# Patient Record
Sex: Male | Born: 1955 | Race: White
Health system: Southern US, Community
[De-identification: ages and names within clinical notes are randomized; demographics above are authoritative.]

## PROBLEM LIST (undated history)

## (undated) DIAGNOSIS — T148XXA Other injury of unspecified body region, initial encounter: Secondary | ICD-10-CM

## (undated) DIAGNOSIS — S43439A Superior glenoid labrum lesion of unspecified shoulder, initial encounter: Secondary | ICD-10-CM

## (undated) DIAGNOSIS — M25519 Pain in unspecified shoulder: Secondary | ICD-10-CM

## (undated) DIAGNOSIS — S82899A Other fracture of unspecified lower leg, initial encounter for closed fracture: Secondary | ICD-10-CM

## (undated) DIAGNOSIS — G589 Mononeuropathy, unspecified: Secondary | ICD-10-CM

## (undated) HISTORY — DX: Superior glenoid labrum lesion of unspecified shoulder, initial encounter: S43.439A

## (undated) HISTORY — DX: Mononeuropathy, unspecified: G58.9

## (undated) HISTORY — PX: WISDOM TOOTH EXTRACTION: SHX21

## (undated) HISTORY — PX: HERNIA REPAIR: SHX51

## (undated) HISTORY — PX: VASECTOMY: SHX75

## (undated) HISTORY — PX: SKIN BIOPSY: SHX1

## (undated) HISTORY — PX: TONSILLECTOMY: SUR1361

## (undated) HISTORY — DX: Pain in unspecified shoulder: M25.519

## (undated) HISTORY — DX: Other fracture of unspecified lower leg, initial encounter for closed fracture: S82.899A

---

## 1975-01-27 DIAGNOSIS — S82892A Other fracture of left lower leg, initial encounter for closed fracture: Secondary | ICD-10-CM

## 1975-01-27 HISTORY — DX: Other fracture of left lower leg, initial encounter for closed fracture: S82.892A

## 1995-04-09 ENCOUNTER — Ambulatory Visit
Admit: 1995-04-09 | Disposition: A | Discharge status: Home / Self Care | Payer: Self-pay | Source: Ambulatory Visit | Admitting: Urology

## 1999-01-27 DIAGNOSIS — E0500 Thyrotoxicosis with diffuse goiter without thyrotoxic crisis or storm: Secondary | ICD-10-CM

## 1999-01-27 DIAGNOSIS — S76309A Unspecified injury of muscle, fascia and tendon of the posterior muscle group at thigh level, unspecified thigh, initial encounter: Secondary | ICD-10-CM

## 1999-01-27 DIAGNOSIS — I499 Cardiac arrhythmia, unspecified: Secondary | ICD-10-CM

## 1999-01-27 DIAGNOSIS — E0590 Thyrotoxicosis, unspecified without thyrotoxic crisis or storm: Secondary | ICD-10-CM

## 1999-01-27 HISTORY — DX: Unspecified injury of muscle, fascia and tendon of the posterior muscle group at thigh level, unspecified thigh, initial encounter: S76.309A

## 1999-01-27 HISTORY — DX: Cardiac arrhythmia, unspecified: I49.9

## 1999-01-27 HISTORY — DX: Thyrotoxicosis with diffuse goiter without thyrotoxic crisis or storm: E05.00

## 1999-01-27 HISTORY — DX: Thyrotoxicosis, unspecified without thyrotoxic crisis or storm: E05.90

## 1999-10-21 ENCOUNTER — Emergency Department: Admit: 1999-10-21 | Payer: Self-pay | Admitting: Emergency Medicine

## 2014-01-26 DIAGNOSIS — M5430 Sciatica, unspecified side: Secondary | ICD-10-CM

## 2014-01-26 HISTORY — DX: Sciatica, unspecified side: M54.30

## 2015-01-27 DIAGNOSIS — K4090 Unilateral inguinal hernia, without obstruction or gangrene, not specified as recurrent: Secondary | ICD-10-CM

## 2015-01-27 HISTORY — DX: Unilateral inguinal hernia, without obstruction or gangrene, not specified as recurrent: K40.90

## 2017-01-26 DIAGNOSIS — M722 Plantar fascial fibromatosis: Secondary | ICD-10-CM

## 2017-01-26 DIAGNOSIS — K4090 Unilateral inguinal hernia, without obstruction or gangrene, not specified as recurrent: Secondary | ICD-10-CM

## 2017-01-26 HISTORY — DX: Unilateral inguinal hernia, without obstruction or gangrene, not specified as recurrent: K40.90

## 2017-01-26 HISTORY — DX: Plantar fascial fibromatosis: M72.2

## 2017-06-09 ENCOUNTER — Other Ambulatory Visit (INDEPENDENT_AMBULATORY_CARE_PROVIDER_SITE_OTHER): Payer: Self-pay

## 2017-06-23 ENCOUNTER — Ambulatory Visit (INDEPENDENT_AMBULATORY_CARE_PROVIDER_SITE_OTHER): Payer: Self-pay | Admitting: Family Medicine

## 2017-08-11 ENCOUNTER — Ambulatory Visit: Payer: Self-pay | Admitting: Family Medicine

## 2017-08-11 ENCOUNTER — Encounter: Payer: Self-pay | Admitting: Family Medicine

## 2017-08-11 VITALS — BP 140/86 | HR 42 | Temp 98.0°F | Resp 16 | Ht 70.75 in | Wt 180.3 lb

## 2017-08-11 DIAGNOSIS — H6981 Other specified disorders of Eustachian tube, right ear: Secondary | ICD-10-CM

## 2017-08-11 DIAGNOSIS — K4090 Unilateral inguinal hernia, without obstruction or gangrene, not specified as recurrent: Secondary | ICD-10-CM

## 2017-08-11 DIAGNOSIS — Z8249 Family history of ischemic heart disease and other diseases of the circulatory system: Secondary | ICD-10-CM | POA: Insufficient documentation

## 2017-08-11 DIAGNOSIS — R7982 Elevated C-reactive protein (CRP): Secondary | ICD-10-CM

## 2017-08-11 DIAGNOSIS — J302 Other seasonal allergic rhinitis: Secondary | ICD-10-CM | POA: Insufficient documentation

## 2017-08-11 DIAGNOSIS — Z8639 Personal history of other endocrine, nutritional and metabolic disease: Secondary | ICD-10-CM | POA: Insufficient documentation

## 2017-08-11 DIAGNOSIS — Z23 Encounter for immunization: Secondary | ICD-10-CM

## 2017-08-11 DIAGNOSIS — R9431 Abnormal electrocardiogram [ECG] [EKG]: Secondary | ICD-10-CM

## 2017-08-11 DIAGNOSIS — E559 Vitamin D deficiency, unspecified: Secondary | ICD-10-CM

## 2017-08-11 DIAGNOSIS — Z0000 Encounter for general adult medical examination without abnormal findings: Secondary | ICD-10-CM

## 2017-08-11 DIAGNOSIS — Z7184 Encounter for health counseling related to travel: Secondary | ICD-10-CM

## 2017-08-11 DIAGNOSIS — Z1211 Encounter for screening for malignant neoplasm of colon: Secondary | ICD-10-CM

## 2017-08-11 DIAGNOSIS — E7841 Elevated Lipoprotein(a): Secondary | ICD-10-CM | POA: Insufficient documentation

## 2017-08-11 MED ORDER — ZOSTER VAC RECOMB ADJUVANTED 50 MCG/0.5ML IM SUSR
50.00 ug | Freq: Once | INTRAMUSCULAR | 1 refills | Status: AC
Start: 2017-08-11 — End: 2017-08-11

## 2017-08-11 NOTE — Progress Notes (Signed)
Subjective:      Patient ID: Joshua Meyers is a 62 y.o. male.    Chief Complaint:  Chief Complaint   Patient presents with   . Annual Exam       HPI:  HPI Patient presents for annual health review and physical exam. He was previously a patient of Dr. Zenovia Jarred. Last physical was 05/2013.  History of low back pain with nerve impingement at L3. Flies on planes frequently. Managed with stretching and exercise.   History of seasonal allergies managed with over the counter meds.   History of elevated Lp(a). Heart disease in father. Had stress test in the past.   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 No recurrence.     Current: has a left inguinal hernia that developed over the last 24 months.   Plantar fasciitis in right heel over last couple months. Has used wrap which stabilzed it. Tried CBD oil which helped with pain temprorarily. Has a massage device he uses. Also benefits from water jets in hot tub and helps it to relax. Has not tried acupuncture.     Ear: intermittent sharp pain in ear when blows nose. Has had in the past with flying. Resolves immediately. Has some relationship to congestion. Always the right ear.     Hx of allergies: takes meds sparingly, uses claritin infrequently. Has been much better since [employer].     Joint pains from previous injuries Manages with exercise and stretching.     Changes in last year: no hospitalizations or ER visits.     Planning visit to [place] for the end of the year. Needs immunizations reviewed for update.       Review of labs:none done prior to visit.     Exercise:Weight lifting 2 days a week. Runs almost daily. Gets 15K steps a day.     Sleep: Sleeps well, even with multiple overseas trips. Does not need any sleep meds or melatonin. Sleeps very deeply and has constantly changing time zones. Sleep times vary depending on schedule, never regular.     Diet: Follows low lectin Gundry diet and has for 2-3 years. Does  well for 5 days a week and enjoys himself on the weekend. Takes some supplements for dietary coverage. Uses plant based fiber that Gundry recommends. Takes this daily.   Adds post-workout plant based protein supplement to keep muscle mass. Has been doing for the last year.   Tried intermittent fasting in the past and got some heart flutters so stopped it.   Weight is always between 175 and 180 pounds.     Stress adaptation: travels, business. Does mindfulness but not disciplined with this. Doesn't "carry things home". Doesn't obsess over outcomes or processes. May not be managing stress as well as in the past.     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     Social:Has reduced alcohol intake. Maybe one drink a week.     Health maintenance:  Colon cancer screening:colonoscopy in past, at least 10 years. No family hx. Will do cologuard.   Screening CT of chest if hx smoking:n/a  Hepatitis C (one time if [phone]): needs  Dental visit: 2 years ago, has scheduled  Eye exam: has eye exam scheduled.     Immunizations:  Tetanus: 2015 Tdap  Prevnar:n/a  Pneumovax:n/a  Shingrix: will recommend  WJX:BJYNW'G get.     Advanced directive: Advanced directive discussed.  Has an Scientist, water quality.  A copy has not been provided. Requested to provide.    PFTs:normal    Hearing:normal    Vision:deferred    OZH:YQMVH brady at 42, slower than in past. NSTwave change. Asymptomatic, referred for cardio eval.     BIA: excellent SMM, BF%, viseral fat 5. Discussed in detail.     Epworth Sleepiness Scale:7  STOP BANG:n/a    Problem List:  Patient Active Problem List   Diagnosis   . History of Graves' disease   . Seasonal allergies   . Elevated Lp(a)   . Family history of heart disease   . Vitamin D deficiency       Current Medications:  Current Outpatient Prescriptions   Medication Sig Dispense Refill   . Ascorbic Acid (VITAMIN C) 1000 MG tablet Take 1,000 mg by mouth daily     . Aspirin-Acetaminophen-Caffeine  (EXCEDRIN EXTRA STRENGTH PO) Take 0.5 tablets by mouth daily     . COLOSTRUM PO Take 1,000 mg by mouth daily     . ECHINACEA PO Take 400 mg by mouth daily     . Fiber Powder Take 1 scoop. by mouth daily Prebio Thrive     . loratadine (CLARITIN) 10 MG tablet Take 10 mg by mouth as needed for Allergies     . Misc Natural Products (GLUCOSAMINE CHONDROITIN MSM) Tab Take 1,500 mg by mouth daily     . Multiple Vitamin (MULTIVITAMIN) capsule Take 1 capsule by mouth daily Centrum Silver        . Omega-3 Fatty Acids (OMEGA 3 PO) Take 1,000 mg by mouth daily         . TURMERIC PO Take 400 mg by mouth daily         . UNABLE TO FIND Take 1 scoop. by mouth daily Med Name: Vital Reds     . Zoster Vac Recomb Adjuvanted (SHINGRIX) 50 MCG/0.5ML Recon Susp Inject 50 mcg into the muscle once for 1 dose 1 each 1     No current facility-administered medications for this visit.        Allergies:  Allergies   Allergen Reactions   . Dust Mite Mixed Allergen Ext [Mite (D. Farinae)]    . Other      Ragweed, seasonal allergies, animal dander       Past Medical History:  Past Medical History:   Diagnosis Date   . Ankle fracture 1960s    Right ankle   . Ankle fracture, left 1977   . Graves disease 2001    Treated 2001   . Hamstring injury 2001   . Hernia, inguinal 2017    Left side   . Pinched nerve     L3   . Plantar fasciitis of right foot 2019   . Sciatica 2016       Past Surgical History:  Past Surgical History:   Procedure Laterality Date   . TONSILLECTOMY  age 73   . VASECTOMY  1992   . VASECTOMY  1992   . WISDOM TOOTH EXTRACTION  age 34       Family History:  Family History   Problem Relation Age of Onset   . Cancer Mother         Bladder   . Stroke Mother    . Heart disease Father    . Dementia Father    . Melanoma Father    . No known problems Sister    . No known problems Brother    . Diabetes  type I Daughter    . Thyroid disease Daughter         s/p ablation   . No known problems Son    . Obesity Maternal Grandmother          overweight   . No known problems Maternal Grandfather    . Alzheimer's disease Paternal Grandmother    . Heart disease Paternal Grandfather    . Heart attack Paternal Grandfather        Social History:  Social History     Social History   . Marital status: Married     Spouse name: N/A   . Number of children: N/A   . Years of education: N/A     Occupational History   . Not on file.     Social History Main Topics   . Smoking status: Never Smoker   . Smokeless tobacco: Never Used      Comment: occasional cigar   . Alcohol use 4.2 oz/week     7 Glasses of wine per week      Comment: wine or martiini, 1 drink daily   . Drug use: No   . Sexual activity: Not on file     Other Topics Concern   . Not on file     Social History Narrative   . No narrative on file       The following sections were reviewed this encounter by the provider:   Allergies  Meds  Problems         ROS:  Review of Systems   Constitutional:        High stress   HENT: Positive for congestion and ear pain.    Musculoskeletal: Positive for arthralgias.   All other systems reviewed and are negative.      Vitals:  BP 140/86 (BP Site: Left arm, Patient Position: Sitting, Cuff Size: Medium)   Pulse (!) 42   Temp 98 F (36.7 C) (Oral)   Resp 16   Ht 1.797 m (5' 10.75")   Wt 81.8 kg (180 lb 4.8 oz)   SpO2 98%   BMI 25.32 kg/m      Objective:     Physical Exam:  Physical Exam General Appearance: NAD, pleasant  HEENT: Eyes: clear  Nose: mild congestion, narrow nasal passages, minimal white/clear discharge  Tympanic Membranes: normal  Pharynx: clear  Oral cavity: no lesions, dentition in good repair  Neck: supple without lymphadenopathy  Thyroid: normal size and consistency  Heart: RRR without murmur, gallop, or rub  Lungs: clear to auscultation  Abdomen: soft, non-tender, non-distended, no masses palpated, no hepatosplenomegaly  Neurologic exam: PERRL, no focal signs, DTRs 2+ and symmetric  Skin: normal color and temp, multiple seborrheic  keratoses  Peripheral pulses: DP and PT 2+, equal bilaterally  Extremities: no clubbing, no edema  Genitalia: normal male without lesions; testes NT and without masses; large left inguinal hernia, reducible; suspect small right inguinal hernia as well  Rectal: no masses, no hemorrhoids, FOBT negative  Prostate: normal size and consistency         Assessment:     1. Routine adult health maintenance  - Comprehensive metabolic panel; Future  - CBC and differential; Future  - PSA Total (Reflex To Free); Future  - Urinalysis with microscopic; Future  - Hepatitis C antibody; Future    2. History of Graves' disease  - TSH; Future  - T4, free; Future  - T3, free; Future    3. Seasonal  allergies    4. Elevated Lp(a)  - CRP, High Sensitive; Future  - Hemoglobin A1C; Future  - Cardio IQ(R) Advanced Lipid Panel; Future    5. Family history of heart disease  - Cardio IQ(R) Advanced Lipid Panel; Future    6. Vitamin D deficiency  - Vitamin D,25 OH, Total; Future    7. Colon cancer screening  - Cologuard; Future    8. Dysfunction of right eustachian tube  Mild, no need for intervention. Manage allergies as usual.   9. Left inguinal hernia  - Ambulatory referral to General Surgery    10. Abnormal EKG  - Cardiology Referral: Rogelio Seen, MD [facility])    11. Travel advice encounter  - Hepatitis B surface antibody; Future    12. Elevated C-reactive protein (CRP)  - CRP, High Sensitive; Future    13. Immunization due  - Zoster Vac Recomb Adjuvanted (SHINGRIX) 50 MCG/0.5ML Recon Susp; Inject 50 mcg into the muscle once for 1 dose  Dispense: 1 each; Refill: 1      Plan:     Schedule a fasting lab visit in the next week or two.   I will report results to you. If needed/desired, we can schedule a phone follow up to go over results.   Collect the cologuard test at home.     Send [place] a copy of your advanced directive.     Schedule to see Dr. Michael Boston or colleague Roselee Nova for hernia evaluation.     Schedule to  see Dr. Georgeanna Lea cardiologist for further evaluation of abnormal EKG. Likely no issue but some slight changes.     You can have the Shingrix vaccine done at your local pharmacy (rx provided) or here, depending on who gets the vaccine in first. It is a 2 shot series and can make you feel a little ill for a day or two.     I will review immunizations and make recommendations for [place] trip later this year.     If plantar fasciitis does not continue to improve, could do a couple of acupuncture sessions     Over 1/2 of the 90 minute appointment was spent in face-to-face discussion, counselling, and management.     Elane Fritz, MD

## 2017-08-11 NOTE — Patient Instructions (Signed)
1. Routine adult health maintenance  - Comprehensive metabolic panel; Future  - CBC and differential; Future  - PSA Total (Reflex To Free); Future  - Urinalysis with microscopic; Future  - Hepatitis C antibody; Future    2. History of Graves' disease  - TSH; Future  - T4, free; Future  - T3, free; Future    3. Seasonal allergies    4. Elevated Lp(a)  - CRP, High Sensitive; Future  - Hemoglobin A1C; Future  - Cardio IQ(R) Advanced Lipid Panel; Future    5. Family history of heart disease  - Cardio IQ(R) Advanced Lipid Panel; Future    6. Vitamin D deficiency  - Vitamin D,25 OH, Total; Future    7. Colon cancer screening  - Cologuard; Future    8. Dysfunction of right eustachian tube  Mild, no need for intervention. Manage allergies as usual.   9. Left inguinal hernia  - Ambulatory referral to General Surgery    10. Abnormal EKG  - Cardiology Referral: Rogelio Seen, MD [facility] - )    11. Travel advice encounter  - Hepatitis B surface antibody; Future    12. Elevated C-reactive protein (CRP)  - CRP, High Sensitive; Future    13. Immunization due  - Zoster Vac Recomb Adjuvanted (SHINGRIX) 50 MCG/0.5ML Recon Susp; Inject 50 mcg into the muscle once for 1 dose  Dispense: 1 each; Refill: 1      Plan:     Schedule a fasting lab visit in the next week or two.   I will report results to you. If needed/desired, we can schedule a phone follow up to go over results.   Collect the cologuard test at home.     Send [place] a copy of your advanced directive.     Schedule to see Dr. Michael Boston or colleague Roselee Nova for hernia evaluation.     Schedule to see Dr. Georgeanna Lea cardiologist for further evaluation of abnormal EKG. Likely no issue but some slight changes.     You can have the Shingrix vaccine done at your local pharmacy (rx provided) or here, depending on who gets the vaccine in first. It is a 2 shot series and can make you feel a little ill for a day or two.     I will review immunizations and make  recommendations for [place] trip later this year.     If plantar fasciitis does not continue to improve, could do a couple of acupuncture sessions     Elane Fritz, MD

## 2017-08-11 NOTE — Progress Notes (Signed)
Audiology test: Hearing test administered to patient. Went over results with patient in a general form by nurse. Results reviewed with patient by physician in detail.     Vision test: Test deferred; patient sees opthalmologist annually for cataract/glaucoma screening. Patient reports needs updated prescription for new glasses. Plans to see eye doctor in near future.     Pulmonary Function Test:Spirometry test administered to patient. Went over results with patient in a general form by nurse. Results reviewed with patient by physician in detail.     Fitness Evaluation:    Body Fat as percentage: In men, over 25% is obese, 20-25% is higher than normal, 16-20% is healthy / normal, <16% or under is considered lean / ideal.   2019 = 16.8%     Visceral Fat: Abdominal "belly" fat.Visceral fat is a type of body fat that exists in the abdomen and surrounds the internal organs. A high level of visceral fat can increase your risk for serious health problems including cardiovascular disease, types 2 diabetes, and increased blood pressure.    2019 = 5 (normal <10).     Fitness / Nutrition:   Current Exercise regimen Runs 3 miles 2-4x per week, lifts weights 2x/weekly, plays 18 holes of golf (sumer only) once a week, stretch through yoga moves 4x per week, walks extensively, and uses a stand-up desk at [facility].   Patient reports daily calorie burn is 900 calories, with an exercise equivalent of an hour a day; typical steps are 15,000 / about 7 miles   Patient currently on a Lectin diet      Weight Loss:  Discussed with patient that a 3.7 pound weight loss would be ideal per [employer]'s recommendations.   Patient to meet with the trainer today to discuss nutrition and fitness regimen.     Patient Education:    Hearing recommendations were always protect your hearing.    Educated patient on importance of getting flu shot every year/season and the benefits as recommended by the CDC.    Educated patient on importance  of getting yearly eye exam with an Opthalmologist for their eye health (e.g. Retina scans, glaucoma checks, etc).

## 2017-08-12 ENCOUNTER — Telehealth: Payer: Self-pay | Admitting: Family Medicine

## 2017-08-12 NOTE — Telephone Encounter (Signed)
Please let Joshua Meyers know I reviewed his vaccination record in anticipation of his trip to [place].   Need to know:   Has he had hepatitis B series? Not on record provided.   How long will he be in [place]? Will he be in cities or rural areas?    He needs a typhoid booster, his has lapsed.   He will need malaria prophylaxis so need to know length of trip.  He will need a prescription for traveler's diarrhea to take with him which I can provide closer to the time of the trip.     Please let me know above info. Also see if pt will complete his Mychart sign-up so we can communicate electronically.   Thanks.

## 2017-08-13 NOTE — Telephone Encounter (Signed)
Left VM for patient requesting call back to office.

## 2017-08-17 NOTE — Telephone Encounter (Signed)
Spoke with patient re: [place] trip. Patient stated he thinks he started the Hep B series while under Dr. Larae Grooms care, but he does not believe he finished it. He said it would be in the records from Dr .Christella Hartigan office (I don't think we have received these yet). He said the trip will be a river boat cruise, last 7-8 days. They will start and end in city settings, but the majority of the trip will be through rural areas on the boat. He leaves 01/24/18 and will be gone about 2 weeks. I relayed your message regarding the other vaccine and Rx's.

## 2017-08-23 ENCOUNTER — Encounter: Payer: Self-pay | Admitting: Family Medicine

## 2017-08-24 NOTE — Telephone Encounter (Signed)
Please contact patient to schedule lab appt. 

## 2017-08-25 ENCOUNTER — Telehealth: Payer: Self-pay | Admitting: Family Medicine

## 2017-08-25 NOTE — Telephone Encounter (Signed)
Sonia from [facility] called requesting the last office visit notes.  Phone [phone].  Fax [phone]

## 2017-08-25 NOTE — Telephone Encounter (Signed)
Called number listed; wrong number provided. Called [facility] at [facility] office, they will verify request and call our office back.

## 2017-08-26 ENCOUNTER — Ambulatory Visit (FREE_STANDING_LABORATORY_FACILITY): Payer: 59

## 2017-08-26 DIAGNOSIS — R7982 Elevated C-reactive protein (CRP): Secondary | ICD-10-CM

## 2017-08-26 DIAGNOSIS — Z7189 Other specified counseling: Secondary | ICD-10-CM

## 2017-08-26 DIAGNOSIS — E7841 Elevated Lipoprotein(a): Secondary | ICD-10-CM

## 2017-08-26 DIAGNOSIS — Z7184 Encounter for health counseling related to travel: Secondary | ICD-10-CM

## 2017-08-26 DIAGNOSIS — Z8639 Personal history of other endocrine, nutritional and metabolic disease: Secondary | ICD-10-CM

## 2017-08-26 DIAGNOSIS — Z23 Encounter for immunization: Secondary | ICD-10-CM

## 2017-08-26 DIAGNOSIS — Z0000 Encounter for general adult medical examination without abnormal findings: Secondary | ICD-10-CM

## 2017-08-26 DIAGNOSIS — Z8249 Family history of ischemic heart disease and other diseases of the circulatory system: Secondary | ICD-10-CM

## 2017-08-26 DIAGNOSIS — E559 Vitamin D deficiency, unspecified: Secondary | ICD-10-CM

## 2017-08-26 LAB — URINALYSIS WITH MICROSCOPIC
Bilirubin, UA: NEGATIVE
Blood, UA: NEGATIVE
Glucose, UA: NEGATIVE
Ketones UA: NEGATIVE
Leukocyte Esterase, UA: NEGATIVE
Nitrite, UA: NEGATIVE
Protein, UR: NEGATIVE
RBC, UA: 0 /hpf (ref 0–5)
Specific Gravity UA: 1.024 (ref 1.001–1.035)
Urine pH: 6 (ref 5.0–8.0)
Urobilinogen, UA: 0.2

## 2017-08-26 LAB — CBC AND DIFFERENTIAL
Absolute NRBC: 0 10*3/uL (ref 0.00–0.00)
Basophils Absolute Automated: 0.06 10*3/uL (ref 0.00–0.08)
Basophils Automated: 1.2 %
Eosinophils Absolute Automated: 0.19 10*3/uL (ref 0.00–0.44)
Eosinophils Automated: 3.9 %
Hematocrit: 44.3 % (ref 37.6–49.6)
Hgb: 15 g/dL (ref 12.5–17.1)
Immature Granulocytes Absolute: 0.01 10*3/uL (ref 0.00–0.07)
Immature Granulocytes: 0.2 %
Lymphocytes Absolute Automated: 1.23 10*3/uL (ref 0.42–3.22)
Lymphocytes Automated: 25.1 %
MCH: 31.3 pg (ref 25.1–33.5)
MCHC: 33.9 g/dL (ref 31.5–35.8)
MCV: 92.5 fL (ref 78.0–96.0)
MPV: 11.4 fL (ref 8.9–12.5)
Monocytes Absolute Automated: 0.39 10*3/uL (ref 0.21–0.85)
Monocytes: 8 %
Neutrophils Absolute: 3.02 10*3/uL (ref 1.10–6.33)
Neutrophils: 61.6 %
Nucleated RBC: 0 /100 WBC (ref 0.0–0.0)
Platelets: 218 10*3/uL (ref 142–346)
RBC: 4.79 10*6/uL (ref 4.20–5.90)
RDW: 12 % (ref 11–15)
WBC: 4.9 10*3/uL (ref 3.10–9.50)

## 2017-08-26 LAB — COMPREHENSIVE METABOLIC PANEL
ALT: 19 U/L (ref 0–55)
AST (SGOT): 21 U/L (ref 5–34)
Albumin/Globulin Ratio: 1.6 (ref 0.9–2.2)
Albumin: 4.4 g/dL (ref 3.5–5.0)
Alkaline Phosphatase: 48 U/L (ref 38–106)
BUN: 15 mg/dL (ref 9.0–28.0)
Bilirubin, Total: 1.2 mg/dL (ref 0.2–1.2)
CO2: 27 mEq/L (ref 21–29)
Calcium: 9.8 mg/dL (ref 8.5–10.5)
Chloride: 105 mEq/L (ref 100–111)
Creatinine: 1.2 mg/dL (ref 0.5–1.5)
Globulin: 2.7 g/dL (ref 2.0–3.7)
Glucose: 92 mg/dL (ref 70–100)
Potassium: 4.5 mEq/L (ref 3.5–5.1)
Protein, Total: 7.1 g/dL (ref 6.0–8.3)
Sodium: 140 mEq/L (ref 136–145)

## 2017-08-26 LAB — GFR: EGFR: >60

## 2017-08-26 LAB — HEMOGLOBIN A1C
Average Estimated Glucose: 102.5 mg/dL
Hemoglobin A1C: 5.2 % (ref 4.6–5.9)

## 2017-08-26 LAB — C-REACTIVE PROTEIN HIGH SENSITIVE: C-Reactive Protein, High Sensitive: 0.04 mg/dL (ref 0.00–0.50)

## 2017-08-26 LAB — VITAMIN D,25 OH,TOTAL: Vitamin D, 25 OH, Total: 36 ng/mL (ref 30–100)

## 2017-08-26 LAB — HEPATITIS B SURFACE ANTIBODY: HEPATITIS B SURFACE ANTIBODY: 104.64

## 2017-08-26 LAB — T4, FREE: T4 Free: 1.07 ng/dL (ref 0.70–1.48)

## 2017-08-26 LAB — TSH: TSH: 2.02 u[IU]/mL (ref 0.35–4.94)

## 2017-08-26 LAB — T3, FREE: T3, Free: 2.9 pg/mL (ref 1.71–3.71)

## 2017-08-26 LAB — HEMOLYSIS INDEX: Hemolysis Index: 4 (ref 0–18)

## 2017-08-26 LAB — PSA TOTAL (REFLEX TO FREE): PSA Total w/Reflex TO PSA Free: 0.873 ng/mL (ref 0.000–4.000)

## 2017-08-26 LAB — HEPATITIS C ANTIBODY: Hepatitis C, AB: NONREACTIVE

## 2017-08-26 NOTE — Progress Notes (Signed)
Blood drawn from left antecubital without complication on first attempt. Patient tolerated procedure and left in stable condition. Labs sent to [facility].

## 2017-09-01 LAB — CARDIO IQ(R) ADVANCED LIPID PANEL
Apolipoprotein B: 97 mg/dL — ABNORMAL HIGH
Cholesterol Total (Quest): 205 mg/dL — ABNORMAL HIGH (ref <200)
Cholesterol/HDL Ratio: 3.5 (ref <5.0)
HDL Cholesterol: 58 mg/dL (ref >40)
HDL Large: 4352 nmol/L (ref 3382–9376)
LDL Chol, Calculated: 131 mg/dL — ABNORMAL HIGH (ref <100)
LDL Medium: 246 nmol/L (ref 122–498)
LDL Particle Number: 1169 nmol/L (ref 732–2035)
LDL Peak Size: 222.3 (ref >=217.4)
LDL Small: 164 nmol/L (ref 85–473)
Lipoprotein (a): 116 nmol/L — ABNORMAL HIGH (ref <75)
Non-HDL Cholesterol: 147 — ABNORMAL HIGH (ref <130)
Triglycerides (Quest): 68 mg/dL (ref <150)

## 2017-09-02 ENCOUNTER — Other Ambulatory Visit: Payer: Self-pay | Admitting: Family Medicine

## 2017-09-02 ENCOUNTER — Encounter: Payer: Self-pay | Admitting: Family Medicine

## 2017-09-02 DIAGNOSIS — E559 Vitamin D deficiency, unspecified: Secondary | ICD-10-CM

## 2017-09-02 MED ORDER — VITAMIN D3 50 MCG (2000 UT) PO CAPS
2000.00 [IU] | ORAL_CAPSULE | Freq: Every day | ORAL | Status: DC
Start: 2017-09-02 — End: 2017-09-14

## 2017-09-14 ENCOUNTER — Telehealth: Payer: Self-pay | Admitting: Family Medicine

## 2017-09-14 DIAGNOSIS — E7841 Elevated Lipoprotein(a): Secondary | ICD-10-CM

## 2017-09-14 DIAGNOSIS — E7800 Pure hypercholesterolemia, unspecified: Secondary | ICD-10-CM | POA: Insufficient documentation

## 2017-09-14 DIAGNOSIS — Z8249 Family history of ischemic heart disease and other diseases of the circulatory system: Secondary | ICD-10-CM

## 2017-09-14 DIAGNOSIS — E559 Vitamin D deficiency, unspecified: Secondary | ICD-10-CM

## 2017-09-14 MED ORDER — VITAMIN D3 1.25 MG (50000 UT) PO CAPS
1.00 | ORAL_CAPSULE | Freq: Two times a day (BID) | ORAL | Status: DC
Start: 2017-09-14 — End: 2019-02-14

## 2017-09-14 NOTE — Telephone Encounter (Signed)
Please offer patient several times for a phone consult to go over labs in the next week or two, in the afternoons. One-half hour phone visit.

## 2017-09-14 NOTE — Telephone Encounter (Addendum)
Phone call to discuss labs:    From previous email to pt re: labs:  You are immune to hepatitis B, so no need for further vaccination.   Your Vitamin D is lower than optimal. I recommend you start a daily supplement of vitamin D3, 2000 IU once daily. We can recheck this level in 3 months.   Blood count, kidney and liver function, electrolytes, urinalysis are all normal.   Thyroid levels are also in good range.   Fasting blood sugar is ok at 92 (better in the 80s) and long term blood sugar control marker Hemoglobin A1c is in very good range at 5.2.   PSA, the prostate test is nice and low.     Current phone call:  All above reviewed.     Cholesterol: Total 205, LDL 131, HDL 58, trig 68  LDLp 1169, smalls 164, mediums 246, all moderate risk range.  LDL peak size 222.3, near optimal  HDL large in high risk range at 4352  Apo B minimally elevated at 97  Lp(a) elevated at 116.   CRP very good at 0.04    Latest comparison available 2012 (prior to low lectin diet):  Chol 177, LDL 112, HDL 53, trig 58.   CRP was elevated at 1.06 (< 1.0 optimal, 1-3 average risk)  A1c at that time was 4.9  Homocysteine was 12    Pt has excellent exercise habits and follows a low lectin diet most days of the week. Not sure what other lifestyle factors could be addressed.   Fam hx: father had ht dz, mother had stroke, pat GF had MI.     Discussed other methods of risk assessment including CT calcium score and [place] Ht inflammation panel. Also discussed testing testosterone levels.     Pt and wife had started RYR 1200mg  and CoQ10 100mg  a couple weeks ago. Discussed possible side effects, but that it is one of the supplements I sometimes recommend. Discussed neuropathy, memory and sleep issues, rhabdo with muscle aches. He has not noticed any change.  Recommend he stay with that supplement and we can recheck in 3 months.     Will get other assessments in the meantime.     Trig/HDL ratio is 1.17. Below 2 is considered optimal. Not sure what  range Gundry suggests.

## 2017-11-24 NOTE — Pre-Procedure Instructions (Signed)
Low risk procedure. No testing per surgeon's H&P. H&P sent to scan.

## 2017-12-02 ENCOUNTER — Ambulatory Visit: Payer: 59

## 2017-12-02 NOTE — Pre-Procedure Instructions (Signed)
Instructed patient to call PCP for when to stop taking aspirin since he has h/o Afib.

## 2017-12-02 NOTE — Pre-Procedure Instructions (Addendum)
Intermediate risk procedure. SCD entered. Faxed orders to pharmacy.    H/o Afib dt Graves disease 2001 which resolved.  Patient cannot remember who his cardiologist was; PCP may have copies per patient. Need labs, a copy of echo and stress test  Faxed PCP Elane Fritz, MD     Notified Misty Stanley, NP anesthesia of history.

## 2017-12-06 ENCOUNTER — Encounter: Payer: Self-pay | Admitting: Family Medicine

## 2017-12-06 ENCOUNTER — Other Ambulatory Visit: Payer: Self-pay | Admitting: Family Medicine

## 2017-12-06 DIAGNOSIS — Z298 Encounter for other specified prophylactic measures: Secondary | ICD-10-CM

## 2017-12-06 DIAGNOSIS — A09 Infectious gastroenteritis and colitis, unspecified: Secondary | ICD-10-CM

## 2017-12-06 DIAGNOSIS — Z7184 Encounter for health counseling related to travel: Secondary | ICD-10-CM

## 2017-12-06 MED ORDER — ATOVAQUONE-PROGUANIL HCL 250-100 MG PO TABS
1.00 | ORAL_TABLET | Freq: Every day | ORAL | 0 refills | Status: AC
Start: 2017-12-06 — End: 2018-01-01

## 2017-12-06 MED ORDER — AZITHROMYCIN 500 MG PO TABS
1000.00 mg | ORAL_TABLET | Freq: Once | ORAL | 0 refills | Status: AC
Start: 2017-12-06 — End: 2017-12-06

## 2017-12-06 NOTE — Pre-Procedure Instructions (Signed)
Leone Brand, RN, BSN  [facility] [address]  [address]  [place], [place] [address]  T [phone]  F [phone]    Update from Bebe Liter via email to writer: This is regarding patient Joshua Meyers, DOB March 20, 1955. I sent records for this patient's pre-op last week. He has 2 MRNs in Epic [phone] & #[id number]). The patient was referred to Dr. Georgeanna Lea at [facility] over the summer, but they responded and said they have not yet seen the patient. I spoke with the patient directly- he said his previous PCP died, and he brought in all the records he has when we saw him this summer. These were scanned into his chart under MRN [id number]. There is no echo or stress test in the documents that were provided, and the chart does not specify which cardiologist the patient saw. He does not remember the name of the cardiologist either, but stated it was back around 2001.

## 2017-12-14 NOTE — Anesthesia Preprocedure Evaluation (Addendum)
Anesthesia Evaluation    AIRWAY    Mallampati: II    TM distance: >3 FB  Neck ROM: full  Mouth Opening:full  Planned to use difficult airway equipment: No CARDIOVASCULAR    cardiovascular exam normal, regular and normal       DENTAL    no notable dental hx     PULMONARY    pulmonary exam normal     OTHER FINDINGS                  Relevant Problems   NEURO/PSYCH   (+) History of Graves' disease       PSS Anesthesia Comments: Does patient have medical clearance?        Anesthesia Plan    ASA 2     general               (Pt seen, chart reviewed.   Discussed r/b/a.   All questions answered.)      intravenous induction   Detailed anesthesia plan: general LMA        Post op pain management: per surgeon    informed consent obtained    Plan discussed with CRNA.    ECG reviewed  pertinent labs reviewed             Signed by: Helaine Chess 12/14/17 3:18 PM

## 2017-12-16 ENCOUNTER — Ambulatory Visit: Payer: 59 | Admitting: Certified Registered"

## 2017-12-16 ENCOUNTER — Encounter
Admission: RE | Disposition: A | Discharge status: Home / Self Care | Payer: Self-pay | Source: Ambulatory Visit | Attending: Surgery

## 2017-12-16 ENCOUNTER — Ambulatory Visit
Admission: RE | Admit: 2017-12-16 | Discharge: 2017-12-16 | Disposition: A | Discharge status: Home / Self Care | Payer: 59 | Source: Ambulatory Visit | Attending: Surgery | Admitting: Surgery

## 2017-12-16 DIAGNOSIS — K4090 Unilateral inguinal hernia, without obstruction or gangrene, not specified as recurrent: Secondary | ICD-10-CM | POA: Insufficient documentation

## 2017-12-16 DIAGNOSIS — Z7982 Long term (current) use of aspirin: Secondary | ICD-10-CM | POA: Insufficient documentation

## 2017-12-16 HISTORY — DX: Unilateral inguinal hernia, without obstruction or gangrene, not specified as recurrent: K40.90

## 2017-12-16 HISTORY — DX: Other injury of unspecified body region, initial encounter: T14.8XXA

## 2017-12-16 SURGERY — HERNIORRHAPHY, INGUINAL
Anesthesia: Anesthesia General | Site: Abdomen | Laterality: Left | Wound class: Clean

## 2017-12-16 MED ORDER — OXYCODONE HCL 5 MG PO TABS
5.0000 mg | ORAL_TABLET | ORAL | 0 refills | Status: AC | PRN
Start: 2017-12-16 — End: 2017-12-23

## 2017-12-16 MED ORDER — FENTANYL CITRATE (PF) 50 MCG/ML IJ SOLN (WRAP)
INTRAMUSCULAR | Status: AC
Start: 2017-12-16 — End: ?
  Filled 2017-12-16: qty 2

## 2017-12-16 MED ORDER — ACETAMINOPHEN 325 MG PO TABS
650.0000 mg | ORAL_TABLET | Freq: Four times a day (QID) | ORAL | 0 refills | Status: AC
Start: 2017-12-16 — End: 2017-12-20

## 2017-12-16 MED ORDER — PROMETHAZINE HCL 25 MG/ML IJ SOLN
6.2500 mg | Freq: Once | INTRAMUSCULAR | Status: DC | PRN
Start: 2017-12-16 — End: 2017-12-16

## 2017-12-16 MED ORDER — BUPIVACAINE-EPINEPHRINE (PF) 0.25% -1:200000 IJ SOLN
INTRAMUSCULAR | Status: DC | PRN
Start: 2017-12-16 — End: 2017-12-16
  Administered 2017-12-16: 5 mL

## 2017-12-16 MED ORDER — IBUPROFEN 200 MG PO TABS
600.0000 mg | ORAL_TABLET | Freq: Three times a day (TID) | ORAL | 0 refills | Status: AC
Start: 2017-12-16 — End: 2017-12-20

## 2017-12-16 MED ORDER — LIDOCAINE HCL 2 % IJ SOLN
INTRAMUSCULAR | Status: DC | PRN
Start: 2017-12-16 — End: 2017-12-16
  Administered 2017-12-16: 80 mg

## 2017-12-16 MED ORDER — HYDROMORPHONE HCL 0.5 MG/0.5 ML IJ SOLN
0.5000 mg | INTRAMUSCULAR | Status: DC | PRN
Start: 2017-12-16 — End: 2017-12-16

## 2017-12-16 MED ORDER — MIDAZOLAM HCL 1 MG/ML IJ SOLN (WRAP)
INTRAMUSCULAR | Status: AC
Start: 2017-12-16 — End: ?
  Filled 2017-12-16: qty 2

## 2017-12-16 MED ORDER — OXYCODONE-ACETAMINOPHEN 5-325 MG PO TABS
1.0000 | ORAL_TABLET | Freq: Once | ORAL | Status: DC | PRN
Start: 2017-12-16 — End: 2017-12-16

## 2017-12-16 MED ORDER — ONDANSETRON HCL 4 MG/2ML IJ SOLN
4.0000 mg | Freq: Once | INTRAMUSCULAR | Status: DC | PRN
Start: 2017-12-16 — End: 2017-12-16

## 2017-12-16 MED ORDER — GABAPENTIN 300 MG PO CAPS
600.0000 mg | ORAL_CAPSULE | Freq: Once | ORAL | Status: AC
Start: 2017-12-16 — End: 2017-12-16
  Administered 2017-12-16: 600 mg via ORAL

## 2017-12-16 MED ORDER — HYDROMORPHONE HCL 2 MG PO TABS
2.0000 mg | ORAL_TABLET | Freq: Once | ORAL | Status: DC | PRN
Start: 2017-12-16 — End: 2017-12-16

## 2017-12-16 MED ORDER — FENTANYL CITRATE (PF) 50 MCG/ML IJ SOLN (WRAP)
INTRAMUSCULAR | Status: DC | PRN
Start: 2017-12-16 — End: 2017-12-16
  Administered 2017-12-16: 100 ug via INTRAVENOUS

## 2017-12-16 MED ORDER — PROPOFOL 10 MG/ML IV EMUL (WRAP)
INTRAVENOUS | Status: DC | PRN
Start: 2017-12-16 — End: 2017-12-16
  Administered 2017-12-16: 120 mg via INTRAVENOUS

## 2017-12-16 MED ORDER — ACETAMINOPHEN 500 MG PO TABS
1000.0000 mg | ORAL_TABLET | Freq: Once | ORAL | Status: AC
Start: 2017-12-16 — End: 2017-12-16
  Administered 2017-12-16: 1000 mg via ORAL

## 2017-12-16 MED ORDER — ONDANSETRON HCL 4 MG/2ML IJ SOLN
INTRAMUSCULAR | Status: DC | PRN
Start: 2017-12-16 — End: 2017-12-16
  Administered 2017-12-16: 4 mg via INTRAVENOUS

## 2017-12-16 MED ORDER — ACETAMINOPHEN 500 MG PO TABS
ORAL_TABLET | ORAL | Status: AC
Start: 2017-12-16 — End: ?
  Filled 2017-12-16: qty 2

## 2017-12-16 MED ORDER — DIPHENHYDRAMINE HCL 50 MG/ML IJ SOLN
12.5000 mg | Freq: Four times a day (QID) | INTRAMUSCULAR | Status: DC | PRN
Start: 2017-12-16 — End: 2017-12-16

## 2017-12-16 MED ORDER — KETOROLAC TROMETHAMINE 30 MG/ML IJ SOLN
INTRAMUSCULAR | Status: DC | PRN
Start: 2017-12-16 — End: 2017-12-16
  Administered 2017-12-16: 30 mg via INTRAVENOUS

## 2017-12-16 MED ORDER — DEXAMETHASONE SODIUM PHOSPHATE 4 MG/ML IJ SOLN (WRAP)
INTRAMUSCULAR | Status: DC | PRN
Start: 2017-12-16 — End: 2017-12-16
  Administered 2017-12-16: 4 mg via INTRAVENOUS

## 2017-12-16 MED ORDER — LACTATED RINGERS IV SOLN
INTRAVENOUS | Status: DC
Start: 2017-12-16 — End: 2017-12-16

## 2017-12-16 MED ORDER — AMMONIA AROMATIC IN INHA
1.0000 | Freq: Once | RESPIRATORY_TRACT | Status: DC | PRN
Start: 2017-12-16 — End: 2017-12-16

## 2017-12-16 MED ORDER — LIDOCAINE HCL (PF) 1 % IJ SOLN
INTRAMUSCULAR | Status: AC
Start: 2017-12-16 — End: ?
  Filled 2017-12-16: qty 30

## 2017-12-16 MED ORDER — SODIUM CHLORIDE 0.9 % IR SOLN
Status: DC | PRN
Start: 2017-12-16 — End: 2017-12-16
  Administered 2017-12-16: 1000 mL

## 2017-12-16 MED ORDER — GABAPENTIN 300 MG PO CAPS
ORAL_CAPSULE | ORAL | Status: AC
Start: 2017-12-16 — End: ?
  Filled 2017-12-16: qty 2

## 2017-12-16 MED ORDER — BUPIVACAINE-EPINEPHRINE (PF) 0.25% -1:200000 IJ SOLN
INTRAMUSCULAR | Status: AC
Start: 2017-12-16 — End: ?
  Filled 2017-12-16: qty 30

## 2017-12-16 MED ORDER — MIDAZOLAM HCL 1 MG/ML IJ SOLN (WRAP)
INTRAMUSCULAR | Status: DC | PRN
Start: 2017-12-16 — End: 2017-12-16
  Administered 2017-12-16: 2 mg via INTRAVENOUS

## 2017-12-16 MED ORDER — SODIUM CHLORIDE 0.9 % IV SOLN
INTRAVENOUS | Status: DC
Start: 2017-12-16 — End: 2017-12-16

## 2017-12-16 MED ORDER — FENTANYL CITRATE (PF) 50 MCG/ML IJ SOLN (WRAP)
25.0000 ug | INTRAMUSCULAR | Status: DC | PRN
Start: 2017-12-16 — End: 2017-12-16

## 2017-12-16 MED ORDER — LIDOCAINE HCL 1 % IJ SOLN
INTRAMUSCULAR | Status: DC | PRN
Start: 2017-12-16 — End: 2017-12-16
  Administered 2017-12-16: 5 mL

## 2017-12-16 SURGICAL SUPPLY — 54 items
ADHESIVE SKIN CLOSURE DERMABOND ADVANCED (Skin Closure) ×1
ADHESIVE SKIN CLOSURE DERMABOND ADVANCED .7 ML LIQUID APPLICATOR (Skin Closure) ×1 IMPLANT
ADHESIVE SKNCLS 2 OCTYL CYNCRLT .7ML (Skin Closure) ×1
BLADE 15 CLASSIC CARBON STEEL TISSUE (Blade) ×1
BLADE 15 CLASSIC CARBON STEEL TISSUE SURGICAL (Blade) ×1 IMPLANT
BLADE SRG CBNSTL 15 BP RB-BCK LF STRL (Blade) ×1
BLADE SRGCLPR W 37.2MM LF NS GP EXIST (Blade) ×1
BLADE SURGICAL CLIPPER WIDE W37.2 MM (Blade) ×1
BLADE SURGICAL CLIPPER WIDE W37.2 MM GENERAL PURPOSE EXIST HANDLE DROP (Blade) IMPLANT
CLIPPER SURGICAL PREP VACUUM SUCTION (Tubing) ×1
CLIPPER SURGICAL PREP VACUUM SUCTION PORT ADJUSTABLE STRAP CLIPVAC (Tubing) IMPLANT
COVER FLEXIBLE LIGHT HANDLE PLASTIC GREEN (Procedure Accessories) ×1 IMPLANT
COVER FLEXIBLE MEDLINE LIGHT HANDLE (Procedure Accessories) ×1
COVER LGHT HNDL PLS LF STRL FLXB DISP (Procedure Accessories) ×1
DRAIN INCS SIL PNRS .25IN 12IN LF STRL (Drain) ×2
DRAIN OD.25 IN RADIOPAQUE PENROSE L12 IN INCISION SILICONE (Drain) ×1 IMPLANT
DRAPE SRG CNTRL + SMS 14IN 4IN 120X100IN (Drape) ×2
DRAPE SURGICAL ARMBOARD COVER TRANSVERSE FENESTRATE TUBE HOLDER L120 (Drape) ×1 IMPLANT
ELECTRODE ADULT PATIENT RETURN L9 FT REM POLYHESIVE ACRYLIC FOAM (Procedure Accessories) ×1 IMPLANT
ELECTRODE PATIENT RETURN L9 FT VALLEYLAB (Procedure Accessories) ×1
ELECTRODE PT RTN RM PHSV ACRL FM C30- LB (Procedure Accessories) ×1
GLOVE SRG 8 ENCR LTX STRL PF TXTR BEAD (Glove) ×1
GLOVE SURGICAL 8 ENCORE POWDER FREE (Glove) ×1
GLOVE SURGICAL 8 ENCORE POWDER FREE TEXTURE BEAD CUFF ORTHOPAEDIC (Glove) ×1 IMPLANT
MANIFOLD SCT 2 STD NPTN 2 LF NS 4 PORT (Filter) ×1
MANIFOLD SUCTION 2 STANDARD 4 PORT (Filter) ×1
MANIFOLD SUCTION 2 STANDARD 4 PORT NEPTUNE 2 WASTE MANAGEMENT SYSTEM (Filter) ×1 IMPLANT
MESH SRG PP XL MRLX PRFX 2X1.6IN LF STRL (Plug) ×1 IMPLANT
MESH XL INGUINAL HERNIA REPAIR GROIN L2 (Plug) ×1 IMPLANT
MESH XL INGUINAL HERNIA REPAIR GROIN L2 IN X W1.6 IN POLYPROPYLENE (Plug) IMPLANT
SOLUTION IRR 0.9% NACL 1000ML LF STRL (Irrigation Solutions) ×1
SOLUTION IRRIGATION 0.9% SODIUM CHLORIDE (Irrigation Solutions) ×1
SOLUTION IRRIGATION 0.9% SODIUM CHLORIDE 1000 ML PLASTIC POUR BOTTLE (Irrigation Solutions) ×1 IMPLANT
SPONGE CHLRPRP TINT 26ML (Applicator) ×2 IMPLANT
STAPLER SKIN L4.1 MM X W6.5 MM 35 WIDE (Staplers) ×1
STAPLER SKIN L4.1 MM X W6.5 MM 35 WIDE STAPLE CARTRIDGE APPOSE ULC (Staplers) IMPLANT
STAPLER SKN SS PLS APS U 4.1X6.5MM LF 35 (Staplers) ×1
SUTURE ABS 3-0 SH VCL 27IN BRD COAT UD (Suture) ×3
SUTURE ABS 4-0 PS2 MNCRL MTPS 27IN MFL (Suture) ×1
SUTURE COATED VICRYL 3-0 SH L27 IN BRAID (Suture) ×2 IMPLANT
SUTURE MONOCRYL 4-0 PS-2 L27 IN (Suture) ×1
SUTURE MONOCRYL 4-0 PS-2 L27 IN MONOFILAMENT UNDYED ABSORBABLE (Suture) ×1 IMPLANT
SUTURE NABSB 0 CT PRLN 30IN MFL BLU (Suture) ×4
SUTURE NABSB 2-0 SH PRLN 30IN MFL BLU (Suture) ×3
SUTURE NABSB SLK 2-0 PRMHND 18IN BRD TIE (Suture) ×1
SUTURE PROLENE BLUE 0 CT L30 IN (Suture) ×4
SUTURE PROLENE BLUE 0 CT L30 IN MONOFILAMENT NONABSORBABLE (Suture) IMPLANT
SUTURE PROLENE BLUE 2-0 SH L30 IN (Suture) ×3
SUTURE PROLENE BLUE 2-0 SH L30 IN MONOFILAMENT NONABSORBABLE (Suture) ×1 IMPLANT
SUTURE SILK PERMA HAND BLACK 2-0 L18 IN (Suture) ×1
SUTURE SILK PERMA HAND BLACK 2-0 L18 IN BRAID TIES 12 STRAND PRECUT (Suture) IMPLANT
TRAY SRG MAJOR PROC IFOH (Pack) ×2
TRAY SURGICAL MAJOR (Pack) ×1 IMPLANT
TUBING CLIPVAC FILTER DISP (Tubing) ×1

## 2017-12-16 NOTE — H&P (Signed)
HISTORY AND PHYSICAL  [facility]      Today's Date: 12/16/2017   Patient Name: Espiritu,Sekai Dahlia Client  Attending Physician: Winona Legato, MD  Admitting Service:  General Surgery    Chief Complaint: Present on Admission:  Left inguinal hernia       History of Present Illness:   Levester Waldridge is a 62 y.o. male who  has a past medical history of Ankle fracture (1960s), Ankle fracture, left (1977), Arrhythmia (2001), Fracture, Graves disease (2001), Hamstring injury (2001), Hernia, inguinal (2017), Hyperthyroidism (2001), Inguinal hernia (2019), Pinched nerve, Plantar fasciitis of right foot (2019), and Sciatica (2016)..  This patient presents to the hospital today to undergo elective surgery due to Present on Admission:  Left inguinal hernia  .      Past Medical History:     Past Medical History:   Diagnosis Date   . Ankle fracture 1960s    Right ankle   . Ankle fracture, left 1977   . Arrhythmia 2001    dt Graves disease which resolved; Afib per [facility]SA history   . Fracture     bilat ankles childhood   . Graves disease 2001    Treated 2001   . Hamstring injury 2001   . Hernia, inguinal 2017    Left side   . Hyperthyroidism 2001    Graves disease for 18 months; resolved on its own   . Inguinal hernia 2019    left side   . Pinched nerve     L3   . Plantar fasciitis of right foot 2019   . Sciatica 2016       Past Surgical History:     Past Surgical History:   Procedure Laterality Date   . HERNIA REPAIR     . TONSILLECTOMY  age 55   . VASECTOMY  1992   . VASECTOMY  1992   . VASECTOMY      25 yrs ago   . WISDOM TOOTH EXTRACTION  age 89       Family History:     Family History   Problem Relation Age of Onset   . Cancer Mother         Bladder   . Stroke Mother    . Heart disease Father    . Dementia Father    . Melanoma Father    . No known problems Sister    . No known problems Brother    . Diabetes type I Daughter    . Thyroid disease Daughter         s/p ablation   . No known problems Son    .  Obesity Maternal Grandmother         overweight   . No known problems Maternal Grandfather    . Alzheimer's disease Paternal Grandmother    . Heart disease Paternal Grandfather    . Heart attack Paternal Grandfather        Social History:     Social History     Socioeconomic History   . Marital status: Married     Spouse name: Not on file   . Number of children: Not on file   . Years of education: Not on file   . Highest education level: Not on file   Occupational History   . Not on file   Social Needs   . Financial resource strain: Not on file   . Food insecurity:     Worry: Not on file  Inability: Not on file   . Transportation needs:     Medical: Not on file     Non-medical: Not on file   Tobacco Use   . Smoking status: Never Smoker   . Smokeless tobacco: Never Used   . Tobacco comment: occasional cigar   Substance and Sexual Activity   . Alcohol use: Yes     Comment: wine or martiini, 1 drink daily   . Drug use: Never   . Sexual activity: Not on file   Lifestyle   . Physical activity:     Days per week: Not on file     Minutes per session: Not on file   . Stress: Not on file   Relationships   . Social connections:     Talks on phone: Not on file     Gets together: Not on file     Attends religious service: Not on file     Active member of club or organization: Not on file     Attends meetings of clubs or organizations: Not on file     Relationship status: Not on file   . Intimate partner violence:     Fear of current or ex partner: Not on file     Emotionally abused: Not on file     Physically abused: Not on file     Forced sexual activity: Not on file   Other Topics Concern   . Not on file   Social History Narrative    ** Merged History Encounter **            Allergies:     Allergies   Allergen Reactions   . Dust Mite Mixed Allergen Ext [Mite (D. Farinae)]    . Other      Ragweed, seasonal allergies, animal dander       Medications:     Prior to Admission medications    Medication Sig Start Date End Date  Taking? Authorizing Provider   aspirin EC 325 MG EC tablet Take 325 mg by mouth daily Half a pill daily   Yes [provider]   Ascorbic Acid (VITAMIN C) 1000 MG tablet Take 1,000 mg by mouth daily    [provider]   Aspirin-Acetaminophen-Caffeine (EXCEDRIN EXTRA STRENGTH PO) Take 0.5 tablets by mouth daily    [provider]   atovaquone-proguanil (MALARONE) 250-100 MG Tab Take 1 tablet by mouth daily for 26 days Start 2 days before travel and continue 7 days after return 12/06/17 01/01/18  Elane Fritz, MD   Cholecalciferol (VITAMIN D3) 50000 units Cap Take 1 capsule by mouth 2 (two) times daily 09/14/17   Elane Fritz, MD   COLOSTRUM PO Take 1,000 mg by mouth daily    [provider]   ECHINACEA PO Take 400 mg by mouth daily    [provider]   Fiber Powder Take 1 scoop. by mouth daily Prebio Thrive    [provider]   loratadine (CLARITIN) 10 MG tablet Take 10 mg by mouth as needed for Allergies    [provider]   Misc Natural Products (GLUCOSAMINE CHONDROITIN MSM) Tab Take 1,500 mg by mouth daily    [provider]   Multiple Vitamin (MULTIVITAMIN) capsule Take 1 capsule by mouth daily Centrum Silver       [provider]   Omega-3 Fatty Acids (OMEGA 3 PO) Take 1,000 mg by mouth daily        [provider]   TURMERIC PO Take 400 mg by mouth daily        [provider]   UNABLE TO FIND Take 1 scoop. by mouth daily Med Name: Vital Reds    [provider]       Review of Systems:   As per the HPI.  The patient denies any additional changes to their otic, opthalmologic, dermatologic, pulmonary, cardiac, gastrointestinal, genitourinary, musculoskeletal, hematologic, constitutional, or psychiatric systems.    Physical Exam:     Vitals:    12/16/17 0956   BP: 142/79   Pulse: (!) 44   Resp: 16   Temp: 98 F (36.7 C)   SpO2: 100%     General appearance - alert, well appearing, and in no  distress  Chest - no tachypnea, retractions or cyanosis  Heart - normal rate and regular rhythm  Abdomen - soft, nontender, nondistended, no masses or organomegaly  HERNIA EXAM: left inguinal hernia, reducible, nontender  Extremities - no pedal edema noted    Labs:     Results     ** No results found for the last 24 hours. **          Rads:     Radiology Results (24 Hour)     ** No results found for the last 24 hours. **          Impression:     Patient Active Problem List   Diagnosis   . History of Graves' disease   . Seasonal allergies   . Elevated Lp(a)   . Family history of heart disease   . Vitamin D deficiency   . Pure hypercholesterolemia     Reducible left inguinal hernia     Plan:   I have discussed the pathophysiology of the development of inguinal hernias and explained to both he and his wife the proposed procedure of an open inguinal hernia repair.  We discussed the risks benefits and potential complications including but not limited to bleeding infection recurrence and postoperative or prolonged pain with nerve entrapment.  Questions have been answered for both of them and written instructions will be provided.  Consent has been obtained.      Signed by: Winona Legato, MD

## 2017-12-16 NOTE — Discharge Instr - AVS First Page (Signed)
AFTER YOUR INGUINAL HERNIA SURGERY    ACTIVITY:  There are no restrictions on daily activities, including going up and down stairs.  We encourage you to walk frequently, and there are no restrictions on the distances you may walk.  After an average-sized groin hernia repair, you may return to physical activity at any time, as comfort allows.  A good rule of thumb is: If it hurts, don't do it!  It is recommended that you avoid extremely strenuous physical activity for four weeks.  Larger hernias may require more extensive restrictions.  Specific exercise regimens will be discussed on your first post-operative visit.  Once you have stopped taking prescription medication and are walking comfortably, you may drive again.     CARE FOR THE INCISION:  24 hours after surgery you may remove the bandage and shower.  No soaking for two weeks, including baths, pools, the ocean, or hot tubs.  Gently pat the incisions dry after showering.  There may be staples or tapes (steri-strips) across the incision, which are to be left in place.  Re-apply a light gauze dressing if you wish.  Use an ice pack over the incisional area intermittently in periods of 15 - 20 minutes for the first 24 hours after surgery.    DIET:  For the first 24 hours after surgery, you may not have much of an appetite or feel able to tolerate heavy foods.  We encourage you to keep up with your liquids.  As your appetite increases, you will find yourself eating normally.  There are no restrictions - just eat what your system can tolerate.    MEDICATION:  You may resume all home medications.  You will be given a prescription for pain medication.  Take this as directed for post-operative pain.  If you are experiencing only mild discomfort, you may find that over-the-counter medication, such as Tylenol (acetaminophen) or Advil/Nuprin (ibuprofen), may be all you need for comfort.  If constipation becomes a problem, an over the counter stool softener or laxative may  be taken.    WHAT TO LOOK FOR:  You may notice a slight drainage (usually pink or reddish in color), bruising, or slight swelling around the incision.  This is normal and not a cause for concern.  Likewise, it is normal to have a lump or hardness under or near the incision.  You may also have bruising and some swelling of the genitalia, which is not uncommon.  However, please call our office immediately or go to the ER if you develop any of the following:      -- excessive drainage, bleeding, redness or swelling at or around the incision  -- fever over 101F  -- increased abdominal pain  -- persistent nausea or vomiting  -- difficulty with urination  -- difficulty breathing, chest pain or calf pain    FOLLOW-UP:  We will see you in our office 7 to 10 days after your surgery and again in several weeks.  Prior to surgery, you should have made an appointment for your first post-operative visit.  If for some reason that appointment was not scheduled, please call our office at [phone] as soon as you return home to schedule your appointment.    DIFFICULTIES:  Please call us if problems or questions arise.  We can be reached at any time, including evenings and weekends, by calling our office number [phone].       Medication List      START taking  these medications    acetaminophen 325 MG tablet  Commonly known as:  TYLENOL  Take 2 tablets (650 mg total) by mouth every 6 (six) hours for 4 days     ibuprofen 200 MG tablet  Commonly known as:  ADVIL,MOTRIN  Take 3 tablets (600 mg total) by mouth every 8 (eight) hours for 4 days     oxyCODONE 5 MG immediate release tablet  Commonly known as:  ROXICODONE  Take 1 tablet (5 mg total) by mouth every 4 (four) hours as needed for Pain        CONTINUE taking these medications    aspirin EC 325 MG EC tablet     atovaquone-proguanil 250-100 MG Tabs  Commonly known as:  MALARONE  Take 1 tablet by mouth daily for 26 days Start 2 days before travel and continue 7 days after  return     COLOSTRUM PO     ECHINACEA PO     EXCEDRIN EXTRA STRENGTH PO     Fiber Powd     Glucosamine Chondroitin MSM Tabs     loratadine 10 MG tablet  Commonly known as:  CLARITIN     multivitamin capsule     OMEGA 3 PO     TURMERIC PO     UNABLE TO FIND     vitamin C 1000 MG tablet     Vitamin D3 1.25 MG (50000 UT) Caps  Take 1 capsule by mouth 2 (two) times daily           Where to Get Your Medications      You can get these medications from any pharmacy    Bring a paper prescription for each of these medications   oxyCODONE 5 MG immediate release tablet     Information about where to get these medications is not yet available    Ask your nurse or doctor about these medications   acetaminophen 325 MG tablet   ibuprofen 200 MG tablet

## 2017-12-16 NOTE — Transfer of Care (Signed)
Pt breathing spontaneously, drowsy, VSS. Report given to RN.

## 2017-12-16 NOTE — Op Note (Signed)
OPERATIVE NOTE    Date Time: 12/16/17 11:56 AM  Patient Name: Joshua Meyers,Joshua Meyers Client  Attending Physician: Winona Legato, MD    Date of Operation:   12/16/2017    Providers Performing:   Surgeon(s):  Winona Legato, MD     Circulator: Rowland Lathe, RN  Relief Circulator: Wiliam Ke, RN  Scrub Person: Donald Prose  First Assistant: Flora Lipps, Richardo Priest  Team Leader: Bluford Kaufmann, RN    The assistant provided continuous assistance throughout the case with all phases of the procedure and was an integral part of this surgery.      Operative Procedure:   Procedure(s):  HERNIORRHAPHY, REPAIR DIRECT INGUINAL WITH MESH AND PLUG    Preoperative Diagnosis:   Pre-Op Diagnosis Codes:     * Unilateral inguinal hernia without obstruction or gangrene, recurrence not specified [K40.90]    Postoperative Diagnosis:   *same    Indications:   Symptomatic left inguinal hernia    Wound Classification:   Clean    Antibiotics: As there is no indication for preoperative antibiotics, none were ordered or given      The ACS Risk Calculator was utilized to estimate the patient's chance of post-operative complications due to existing co-morbidities.  The results were presented to the patient in the pre-op holding area and discussed in detail.  All questions were answered and having understood, the patient agreed to proceed.        Findings:   There was a large direct inguinal hernia occupying the entire canal floor.  There was no indirect component and no lipoma of the cord.    Details of Procedure:   Consent was obtained in the preoperative area after discussing with the patient and with his wife.  We discussed the proposed procedure as well as the risks, benefits and potential complications.  The patient was then transported to the operating room, placed in the supine position where adequate general anesthesia was obtained.  The patient was then prepped and draped in a sterile fashion and a pre-incision  timeout was again performed with all team members participating and no concerns being voiced.      The area medial to the right ASIS was infiltrated with a local anesthetic solution to block the ilioinguinal nerve.  I created an incision through the skin parallel to the inguinal ligament from tubercle toward the ASIS anddissected down to the external oblique fascia with knife and electrocautery. The external oblique was opened in the direction of its fibers thru the external ring.  I was able to identify the spermatic cord mobilize this and loop with a penrose drain.  The cremasteric fibers were divided and the cord evaluated for a hernia. No indirect hernia was found.   A large direct hernia was identified.  This area was mobilized, reduced, and I placed an extra-large Bard PerFix mesh plug in the preperitoneal space. It was secured with 2-0 Prolene in the shelving edge of the inguinal ligament and in the conjoined tendon.  Next the mesh patch was placed in the canal floor and secured near the tubercle and in the conjoined tendon superiorly and the shelving edge inferiorly with 2-0 Prolene.  The cord was placed into the tapered keyhole and the legs of the mesh placed around the cord and secured laterally with 2-0 Prolene as well.  With good hemostasis achieved, I then closed what was left of the external oblique fascia with a 3-0 Vicryl suture in a simple running  fashion, Scarpa's was closed with 3-0 Vicryl, 3-0 Vicryl sutures were used in an interrupted fashion to close the dermis, and the skin closed with 4-0 Monocryl and covered with Dermabond.  All counts were correct.  No complications.  Blood loss was minimal.  The patient tolerated it well.    The patient was then awakened in the OR and transported to the PACU in stable condition      Estimated Blood Loss:   None    Replaced fluids:   Lactated Ringer    Implants:     Implant Name Type Inv. Item Serial No. Manufacturer Lot No. LRB No. Used Action   MESH  MARLEX PLUG XLG NLTX - [id number] Plug MESH MARLEX PLUG XLG NLTX  BARD DAVOL [id number] Left 1 Implanted       Drains:   Drain #1:   None    Specimens:   none    Complications:   * None *      Signed by: Winona Legato, MD

## 2017-12-16 NOTE — Discharge Instructions (Signed)
You have received Toradol 30mg  at 11:38 am . Do not take Ibuprofen/Advil/Motrin until  6:38 pm.  Anesthesia: After Your Surgery  You've just had surgery. During surgery, you received medication called anesthesia to keep you comfortable and pain-free. After surgery, you may experience some pain or nausea. This is normal. Here are some tips for feeling better and recovering after surgery.     Stay on schedule with your medication.   Going Home  Your doctor or nurse will show you how to take care of yourself when you go home. He or she will also answer your questions. Have an adult family member or friend drive you home. For the first 24 hours after your surgery:   Do not drive or use heavy equipment.   Do not make important decisions or sign documents.   Avoid alcohol.   Have someone stay with you, if possible. They can watch for problems and help keep you safe.  Be sure to keep all follow-up doctor's appointments. And rest after your procedure for as long as your doctor tells you to.    Coping with Pain  If you have pain after surgery, pain medication will help you feel better. Take it as directed, before pain becomes severe. Also, ask your doctor or pharmacist about other ways to control pain, such as with heat, ice, and relaxation. And follow any other instructions your surgeon or nurse gives you.    Tips for Taking Pain Medication  To get the best relief possible, remember these points:   Pain medications can upset your stomach. Taking them with a little food may help.   Most pain relievers taken by mouth need at least 20 to 30 minutes to take effect.   Taking medication on a schedule can help you remember to take it. Try to time your medication so that you can take it before beginning an activity, such as dressing, walking, or sitting down for dinner.   Constipation is a common side effect of pain medications. Drink lots of fluids. Eating fruit and vegetables can also help. Don't take laxatives unless  your surgeon has prescribed them.   Mixing alcohol and pain medication can cause dizziness and slow your breathing. It can even be fatal. Don't drink alcohol while taking pain medication.   Pain medication can slow your reflexes. Don't drive or operate machinery while taking pain medication,    Managing Nausea  Some people have an upset stomach after surgery. This is often due to anesthesia, pain, pain medications, or the stress of surgery. The following tips will help you manage nausea and get good nutrition as you recover. If you were on a special diet before surgery, ask your doctor if you should follow it during recovery. These tips may help:   Don't push yourself to eat. Your body will tell you what to eat and when.   Start off with liquids and soup. They are easier to digest.   Progress to semisolids (mashed potatoes, applesauce, and gelatin) as you feel ready.   Slowly move to solid foods. Don't eat fatty, rich, or spicy foods at first.   Don't force yourself to have three large meals a day. Instead, eat smaller amounts more often.   Take pain medications with a small amount of solid food, such as crackers or toast.    Important:   Prevent blood clots by wiggling your toes and tightening your calf muscle 20 times every hour while awake until you resume normal walking activity.  Call Your Surgeon If…  You still have pain an hour after taking medication (it may not be strong enough).  You feel too sleepy, dizzy, or groggy (medication may be too strong).  You have side effects like nausea, vomiting, or skin changes (rash, itching, or hives).  Signs of infection - fever over 101, chills. Incision site redness, warmth, swelling, foul odor or purulent drainage.  Persistent bleeding at the operative site.   © 2000-2011 [employer], [address], [place] [address]. All rights reserved. This information is not intended as a substitute for  professional medical care. Always follow your healthcare professional's instructions.

## 2017-12-16 NOTE — Progress Notes (Signed)
Patient meet discharge criteria at this time, vital signs stable, no N/V, tolerating po fluids, pain is controlled, declines offered pain medication. Surgical site is clean, dry  and intact.      Discharge instructions provided and explained to patient and family, verbalized good understanding. Patient and family verbalized readiness to go home. Patient and family aware when to call and follow-up with the doctor. Discharge form signed and completed by his wife.    Prescription called to the [facility] OP pharmacy, wife aware to pick it up. Ice pack provided.

## 2017-12-17 ENCOUNTER — Encounter: Payer: Self-pay | Admitting: Surgery

## 2017-12-19 NOTE — Anesthesia Postprocedure Evaluation (Signed)
Anesthesia Post Evaluation    Patient: Joshua Oehlert    Procedure(s):  HERNIORRHAPHY, REPAIR DIRECT INGUINAL WITH MESH    Anesthesia type: general    Last Vitals:   Vitals Value Taken Time   BP 114/73 12/16/2017 12:40 PM   Temp 36.4 C (97.6 F) 12/16/2017 12:00 PM   Pulse 43 12/16/2017 12:40 PM   Resp 16 12/16/2017 12:30 PM   SpO2 99 % 12/16/2017 12:40 PM                 Anesthesia Post Evaluation:     Patient Evaluated: PACU  Patient Participation: complete - patient participated  Level of Consciousness: awake  Pain Score: 1  Pain Management: adequate  Multimodal analgesia pain management approach    Airway Patency: patent    Anesthetic complications: No      PONV Status: none    Cardiovascular status: acceptable  Respiratory status: acceptable  Hydration status: acceptable        Signed by: Daine Gip, 12/19/2017 9:01 PM

## 2017-12-20 ENCOUNTER — Encounter: Payer: Self-pay | Admitting: Family Medicine

## 2017-12-20 DIAGNOSIS — Z23 Encounter for immunization: Secondary | ICD-10-CM

## 2017-12-20 DIAGNOSIS — Z1211 Encounter for screening for malignant neoplasm of colon: Secondary | ICD-10-CM

## 2017-12-21 NOTE — Telephone Encounter (Signed)
Dr. Okey Dupre, please place orders for vaccines as appropriate. Kenney Houseman, please contact patient to set up appt for nurse visit

## 2017-12-21 NOTE — Telephone Encounter (Signed)
Please advise when pt should schedule follow up with you

## 2017-12-30 ENCOUNTER — Encounter: Payer: Self-pay | Admitting: Family Medicine

## 2017-12-30 MED ORDER — HEPATITIS A VACCINE 1440 EL U/ML IM SUSP
0.50 mL | Freq: Once | INTRAMUSCULAR | 2 refills | Status: AC
Start: 2017-12-30 — End: 2017-12-30

## 2017-12-30 NOTE — Addendum Note (Signed)
Addended by: Temple Pacini D on: 12/30/2017 02:35 PM     Modules accepted: Orders

## 2017-12-30 NOTE — Addendum Note (Signed)
Addended by: Temple Pacini D on: 12/30/2017 04:06 PM     Modules accepted: Orders

## 2017-12-30 NOTE — Progress Notes (Signed)
Pt given incorrect travel recommendation. He is immune to hep B and does not need hep B vaccine for travel. Orders corrected.

## 2017-12-30 NOTE — Progress Notes (Addendum)
Please call pt. He does not need hep B as stated incorrectly in the travel advice patient message.     He should get hep A but that needs to be done at the pharmacy. Rx has been faxed to pharmacy    If he wants flu shot here Friday, that is fine. Order entered.

## 2017-12-31 ENCOUNTER — Ambulatory Visit: Payer: 59

## 2017-12-31 ENCOUNTER — Telehealth: Payer: Self-pay | Admitting: Family Medicine

## 2017-12-31 NOTE — Telephone Encounter (Signed)
Left VM 12/30/2017 and today, 12/31/17 @09 :26 requesting patient call office re: vaccine appt today. Patient immune to hep B, so does not need that vaccine; hep A Rx faxed to patient's pharmacy. If patient needs flu vaccine, can receive in office today as planned.

## 2017-12-31 NOTE — Telephone Encounter (Signed)
Pt called at 1503 and let us know he was in the middle of a meeting and received Angelique Blonder message about having his Hep A done at the pharmacy on file and that he will have his flu vaccination done at the same time-TM @1510 

## 2018-04-11 ENCOUNTER — Telehealth: Payer: Self-pay | Admitting: Family Medicine

## 2018-04-11 NOTE — Telephone Encounter (Signed)
MyChart message sent by patient's wife 04/08/2018:    "Dr Okey Dupre - my husband Joshua Meyers (also your patient), developed a mild fever this afternoon (99.5). He feels fine, just tired. He was tested for coronavirus in [place] on 3/2 as a requirement for a meeting. The test was negative. He has been traveling home, and to [place] since then. Should he be tested, and if so, where and when? He is currently home and plans to stay here..."    Attempted to call patient, however call went straight to voicemail and unable to leave message. VM left for wife requesting she or patient contact our office, wife listed as authorized per HIPAA form.

## 2018-04-11 NOTE — Telephone Encounter (Signed)
Please advise 

## 2018-04-11 NOTE — Telephone Encounter (Signed)
Please advise re: lab/CT scheduling

## 2018-04-12 NOTE — Addendum Note (Signed)
Addended by: Temple Pacini D on: 04/12/2018 02:18 PM     Modules accepted: Orders

## 2018-06-29 ENCOUNTER — Telehealth: Payer: Self-pay | Admitting: Family Medicine

## 2018-06-29 ENCOUNTER — Encounter: Payer: Self-pay | Admitting: Family Medicine

## 2018-06-29 DIAGNOSIS — Z20822 Contact with and (suspected) exposure to covid-19: Secondary | ICD-10-CM

## 2018-06-29 NOTE — Telephone Encounter (Signed)
Patient's wife called to request COVID-19 screening for family. Wife reports patient's mother, currently at [facility], has tested positive for COVID-19. Patient & wife's last contact with mother was 06/17/2018, and she was not symptomatic at the time. Patient and wife are both asymptomatic. Patient also had contact with his siblings 3 days ago, who had been in contact with mother who was "not feeling well", had not yet been tested for COVID. Both patient and wife have had contact with their son (lives in their home), daughter and son-in-law. Daughter is high-risk per wife. Wife requesting they all be tested for COVID-19. Wife reports all family members (spouse, son, daughter, and son-in-law) are asymptomatic at this time.

## 2018-06-29 NOTE — Telephone Encounter (Signed)
Spoke with patient's wife to relay message; wife verbalized understanding, stated she will schedule appts for herself & patient. Copy of instructions sent via MyChart message.

## 2018-06-29 NOTE — Telephone Encounter (Signed)
Agree with covid testing.     For drive-through testing, I will put an order for the test in the system. You should call [phone] to arrange a testing time at a convenient testing center. Do not go to a testing center without a testing appointment.   Here are the nearby testing centers:  [facility] [place] is exclusively operating as a Respiratory Illness Clinic and is  open 7 days a week from 8 a.m. to 8 p.m. Services include scheduled* vehicle-side  COVID-19 testing   [facility] [place] [facility] [place] are exclusively operating as  Respiratory Illness Clinics and are open Monday - Friday from 8 a.m. to 8 p.m. Services  include scheduled* vehicle-side COVID-19 testing   [facility] [place] is operating as a Respiratory Illness Clinic Monday -  Friday from 1 to 4:30 p.m. Vehicle-side COVID-19 testing appointments are scheduled*  between 2 and 4 p.m.   [facility] [place] is a COVID-19 testing site. Scheduled* vehicle-side COVID-  19 testing is offered Monday - Friday from 8 a.m. to 4:30 p.m.

## 2018-06-30 ENCOUNTER — Other Ambulatory Visit: Payer: Self-pay | Admitting: Family Medicine

## 2018-06-30 ENCOUNTER — Ambulatory Visit (INDEPENDENT_AMBULATORY_CARE_PROVIDER_SITE_OTHER): Payer: 59

## 2018-06-30 DIAGNOSIS — R059 Cough, unspecified: Secondary | ICD-10-CM

## 2018-06-30 NOTE — Progress Notes (Signed)
Specimen collected by Respiratory Illness Clinic Nurse.

## 2018-06-30 NOTE — Addendum Note (Signed)
Addended by: Gretel Acre on: 06/30/2018 09:43 AM     Modules accepted: Orders

## 2018-07-02 LAB — CORONAVIRUS, COVID-19: SARS-CoV-2 RNA (COVID-19) Qualitative NAAT: NOT DETECTED

## 2018-07-04 ENCOUNTER — Encounter: Payer: Self-pay | Admitting: Family Medicine

## 2018-12-07 ENCOUNTER — Telehealth: Payer: Self-pay | Admitting: Family Medicine

## 2018-12-07 NOTE — Telephone Encounter (Signed)
Noted  

## 2018-12-07 NOTE — Telephone Encounter (Signed)
Pt called and said that he needed help getting a COVID test. HE is traveling to the [place] on Sunday and needs a test 96 hours prior to travel.     Pt phone: [phone]

## 2018-12-07 NOTE — Telephone Encounter (Signed)
Spoke with patient re: testing options for travel screening, notified [facility] is not performing asymptomatic travel screening at this time. Discussed PCR vs. Rapid screening; patient stated he is required to have PCR testing. Provided options for testing locations via both e-mail and MyChart per patient request.

## 2019-01-26 ENCOUNTER — Other Ambulatory Visit: Payer: Self-pay | Admitting: Family Medicine

## 2019-01-26 DIAGNOSIS — Z0000 Encounter for general adult medical examination without abnormal findings: Secondary | ICD-10-CM

## 2019-01-26 DIAGNOSIS — Z8639 Personal history of other endocrine, nutritional and metabolic disease: Secondary | ICD-10-CM

## 2019-01-26 DIAGNOSIS — E559 Vitamin D deficiency, unspecified: Secondary | ICD-10-CM

## 2019-01-26 DIAGNOSIS — E7800 Pure hypercholesterolemia, unspecified: Secondary | ICD-10-CM

## 2019-01-26 DIAGNOSIS — E7841 Elevated Lipoprotein(a): Secondary | ICD-10-CM

## 2019-01-30 ENCOUNTER — Ambulatory Visit (FREE_STANDING_LABORATORY_FACILITY): Payer: No Typology Code available for payment source

## 2019-01-30 DIAGNOSIS — E559 Vitamin D deficiency, unspecified: Secondary | ICD-10-CM

## 2019-01-30 DIAGNOSIS — E7841 Elevated Lipoprotein(a): Secondary | ICD-10-CM

## 2019-01-30 DIAGNOSIS — Z0000 Encounter for general adult medical examination without abnormal findings: Secondary | ICD-10-CM

## 2019-01-30 DIAGNOSIS — Z8639 Personal history of other endocrine, nutritional and metabolic disease: Secondary | ICD-10-CM

## 2019-01-30 DIAGNOSIS — E7800 Pure hypercholesterolemia, unspecified: Secondary | ICD-10-CM

## 2019-01-30 LAB — URINALYSIS REFLEX TO MICROSCOPIC EXAM - REFLEX TO CULTURE
Bilirubin, UA: NEGATIVE
Blood, UA: NEGATIVE
Glucose, UA: NEGATIVE
Ketones UA: NEGATIVE
Leukocyte Esterase, UA: NEGATIVE
Nitrite, UA: NEGATIVE
Protein, UR: NEGATIVE
Specific Gravity UA: 1.028 (ref 1.001–1.035)
Urine Bacteria: NONE SEEN /hpf
Urine pH: 6 (ref 5.0–8.0)
Urobilinogen, UA: 0.2 (ref 0.2–2.0)

## 2019-01-30 LAB — CBC AND DIFFERENTIAL
Absolute NRBC: 0 10*3/uL (ref 0.00–0.00)
Basophils Absolute Automated: 0.04 10*3/uL (ref 0.00–0.08)
Basophils Automated: 0.7 %
Eosinophils Absolute Automated: 0.18 10*3/uL (ref 0.00–0.44)
Eosinophils Automated: 3.2 %
Hematocrit: 41.5 % (ref 37.6–49.6)
Hgb: 14.1 g/dL (ref 12.5–17.1)
Immature Granulocytes Absolute: 0.02 10*3/uL (ref 0.00–0.07)
Immature Granulocytes: 0.4 %
Lymphocytes Absolute Automated: 1.33 10*3/uL (ref 0.42–3.22)
Lymphocytes Automated: 23.9 %
MCH: 30.9 pg (ref 25.1–33.5)
MCHC: 34 g/dL (ref 31.5–35.8)
MCV: 90.8 fL (ref 78.0–96.0)
MPV: 11.5 fL (ref 8.9–12.5)
Monocytes Absolute Automated: 0.48 10*3/uL (ref 0.21–0.85)
Monocytes: 8.6 %
Neutrophils Absolute: 3.51 10*3/uL (ref 1.10–6.33)
Neutrophils: 63.2 %
Nucleated RBC: 0 /100 WBC (ref 0.0–0.0)
Platelets: 194 10*3/uL (ref 142–346)
RBC: 4.57 10*6/uL (ref 4.20–5.90)
RDW: 13 % (ref 11–15)
WBC: 5.56 10*3/uL (ref 3.10–9.50)

## 2019-01-30 LAB — COMPREHENSIVE METABOLIC PANEL
ALT: 62 U/L — ABNORMAL HIGH (ref 0–55)
AST (SGOT): 50 U/L — ABNORMAL HIGH (ref 5–34)
Albumin/Globulin Ratio: 1.6 (ref 0.9–2.2)
Albumin: 4.1 g/dL (ref 3.5–5.0)
Alkaline Phosphatase: 54 U/L (ref 38–106)
Anion Gap: 10 (ref 5.0–15.0)
BUN: 18 mg/dL (ref 9.0–28.0)
Bilirubin, Total: 1.1 mg/dL (ref 0.2–1.2)
CO2: 24 mEq/L (ref 21–29)
Calcium: 8.8 mg/dL (ref 8.5–10.5)
Chloride: 108 mEq/L (ref 100–111)
Creatinine: 1.1 mg/dL (ref 0.5–1.5)
Globulin: 2.5 g/dL (ref 2.0–3.7)
Glucose: 93 mg/dL (ref 70–100)
Potassium: 4 mEq/L (ref 3.5–5.1)
Protein, Total: 6.6 g/dL (ref 6.0–8.3)
Sodium: 142 mEq/L (ref 136–145)

## 2019-01-30 LAB — HEMOLYSIS INDEX: Hemolysis Index: 0 (ref 0–18)

## 2019-01-30 LAB — T3, FREE: T3, Free: 2.58 pg/mL (ref 1.71–3.71)

## 2019-01-30 LAB — VITAMIN D,25 OH,TOTAL: Vitamin D, 25 OH, Total: 41 ng/mL (ref 30–100)

## 2019-01-30 LAB — VITAMIN B12: Vitamin B-12: 417 pg/mL (ref 211–911)

## 2019-01-30 LAB — HEMOGLOBIN A1C
Average Estimated Glucose: 96.8 mg/dL
Hemoglobin A1C: 5 % (ref 4.6–5.9)

## 2019-01-30 LAB — PSA TOTAL (REFLEX TO FREE): PSA Total w/Reflex TO PSA Free: 0.54 ng/mL (ref 0.000–4.000)

## 2019-01-30 LAB — C-REACTIVE PROTEIN HIGH SENSITIVE: C-Reactive Protein, High Sensitive: 0.1 mg/dL (ref 0.00–0.50)

## 2019-01-30 LAB — TSH: TSH: 1.96 u[IU]/mL (ref 0.35–4.94)

## 2019-01-30 LAB — GFR: EGFR: >60

## 2019-01-30 LAB — T4, FREE: T4 Free: 0.91 ng/dL (ref 0.70–1.48)

## 2019-01-30 NOTE — Progress Notes (Signed)
Blood drawn from Right antecubital without complication on 1st attempt. Patient tolerated procedure and left in stable condition. Labs sent to [facility].

## 2019-02-02 ENCOUNTER — Encounter: Payer: Self-pay | Admitting: Family Medicine

## 2019-02-02 LAB — CARDIO IQ(R) ADVANCED LIPID PANEL
Apolipoprotein B: 99 mg/dL — ABNORMAL HIGH
Cholesterol Total (Quest): 206 mg/dL — ABNORMAL HIGH (ref <200)
Cholesterol/HDL Ratio: 3.4 (ref <5.0)
HDL Cholesterol: 60 mg/dL (ref >=40)
HDL Large: 5151 nmol/L — ABNORMAL LOW (ref >6729)
LDL Chol, Calculated: 131 — ABNORMAL HIGH (ref <100)
LDL Medium: 284 nmol/L — ABNORMAL HIGH (ref <215)
LDL Particle Number: 1330 nmol/L — ABNORMAL HIGH (ref <1138)
LDL Peak Size: 222.3 — ABNORMAL LOW (ref >222.9)
LDL Small: 154 nmol/L — ABNORMAL HIGH (ref <142)
Lipoprotein (a): 94 nmol/L — ABNORMAL HIGH (ref <75)
Non-HDL Cholesterol: 146 — ABNORMAL HIGH (ref <130)
Triglycerides (Quest): 56 mg/dL (ref <150)

## 2019-02-14 ENCOUNTER — Ambulatory Visit (INDEPENDENT_AMBULATORY_CARE_PROVIDER_SITE_OTHER): Payer: No Typology Code available for payment source | Admitting: Family Medicine

## 2019-02-14 ENCOUNTER — Encounter: Payer: Self-pay | Admitting: Family Medicine

## 2019-02-14 VITALS — BP 128/82 | HR 39 | Temp 97.1°F | Resp 16 | Ht 70.5 in | Wt 180.0 lb

## 2019-02-14 DIAGNOSIS — E7800 Pure hypercholesterolemia, unspecified: Secondary | ICD-10-CM

## 2019-02-14 DIAGNOSIS — Z8249 Family history of ischemic heart disease and other diseases of the circulatory system: Secondary | ICD-10-CM

## 2019-02-14 DIAGNOSIS — E7841 Elevated Lipoprotein(a): Secondary | ICD-10-CM

## 2019-02-14 DIAGNOSIS — E559 Vitamin D deficiency, unspecified: Secondary | ICD-10-CM

## 2019-02-14 DIAGNOSIS — Z8639 Personal history of other endocrine, nutritional and metabolic disease: Secondary | ICD-10-CM

## 2019-02-14 DIAGNOSIS — M62838 Other muscle spasm: Secondary | ICD-10-CM

## 2019-02-14 DIAGNOSIS — Z0000 Encounter for general adult medical examination without abnormal findings: Secondary | ICD-10-CM

## 2019-02-14 DIAGNOSIS — Z23 Encounter for immunization: Secondary | ICD-10-CM

## 2019-02-14 DIAGNOSIS — E538 Deficiency of other specified B group vitamins: Secondary | ICD-10-CM | POA: Insufficient documentation

## 2019-02-14 DIAGNOSIS — N401 Enlarged prostate with lower urinary tract symptoms: Secondary | ICD-10-CM

## 2019-02-14 DIAGNOSIS — R001 Bradycardia, unspecified: Secondary | ICD-10-CM

## 2019-02-14 DIAGNOSIS — R002 Palpitations: Secondary | ICD-10-CM

## 2019-02-14 DIAGNOSIS — M180 Bilateral primary osteoarthritis of first carpometacarpal joints: Secondary | ICD-10-CM | POA: Insufficient documentation

## 2019-02-14 DIAGNOSIS — R3912 Poor urinary stream: Secondary | ICD-10-CM | POA: Insufficient documentation

## 2019-02-14 DIAGNOSIS — R7989 Other specified abnormal findings of blood chemistry: Secondary | ICD-10-CM

## 2019-02-14 LAB — COMPREHENSIVE METABOLIC PANEL
ALT: 25 U/L (ref 0–55)
AST (SGOT): 25 U/L (ref 5–34)
Albumin/Globulin Ratio: 1.6 (ref 0.9–2.2)
Albumin: 4.5 g/dL (ref 3.5–5.0)
Alkaline Phosphatase: 52 U/L (ref 38–106)
Anion Gap: 10 (ref 5.0–15.0)
BUN: 21 mg/dL (ref 9.0–28.0)
Bilirubin, Total: 0.8 mg/dL (ref 0.2–1.2)
CO2: 24 mEq/L (ref 21–29)
Calcium: 9.7 mg/dL (ref 8.5–10.5)
Chloride: 107 mEq/L (ref 100–111)
Creatinine: 1.2 mg/dL (ref 0.5–1.5)
Globulin: 2.8 g/dL (ref 2.0–3.7)
Glucose: 87 mg/dL (ref 70–100)
Potassium: 4.6 mEq/L (ref 3.5–5.1)
Protein, Total: 7.3 g/dL (ref 6.0–8.3)
Sodium: 141 mEq/L (ref 136–145)

## 2019-02-14 LAB — HEMOLYSIS INDEX: Hemolysis Index: 5 (ref 0–18)

## 2019-02-14 LAB — GFR: EGFR: >60

## 2019-02-14 NOTE — Progress Notes (Signed)
Audiology test: Hearing test administered to patient. Went over results with patient in a general form by nurse. Results reviewed with patient by physician in detail.       Vision test:  Test deferred. Patient sees ophthalmologist for glaucoma/cataract screening. Has not been in 1.5 years; aware he needs full screening, declines in-office exam at this time.        Fitness Evaluation:    Body Fat as percentage: In men, over 25% is obese, 20-25% is higher than normal, 16-20% is healthy / normal, <16% or under is considered lean / ideal.   2021 = 18.1%   2020 = 16.8%       Visceral Fat: Abdominal "belly" fat.Visceral fat is a type of body fat that exists in the abdomen and surrounds the internal organs. A high level of visceral fat can increase your risk for serious health problems including cardiovascular disease, types 2 18.1diabetes, and increased blood pressure.     2021 = 6 (normal <10).    2020 = 5    Fitness / Nutrition:   Current Exercise regimen: daily routine with yoga and weights, wants to add muscle mass; runs 4x/week.   Discussed dietary restrictions; patient is currently on Gundry Anti-lectin diet, avoids carbs, wants to work avoiuding sugar   Discussed with patient that a 6.6 pound weight loss would be ideal per [employer]'s recommendations.      Patient to meet with the trainer at a later date to discuss nutrition and fitness regimen.      Patient Education:    Discussed ways to protect hearing & prevent further loss.   Educated patient on importance of getting flu shot every year/season and the benefits as recommended by the CDC.    Educated patient on importance of getting yearly eye exam with an Ophthalmologist for their eye health (e.g. Retina scans, glaucoma checks, etc).   Discussed purpose and importance of advanced directive; patient has an advanced directive. Patient to send copy to be added to chart.    Tdap vaccine administered in the Left deltoid. Patient tolerated procedure  without complications. Patient given VIS sheet and educated on possible side effects prior to administration. Patient advised to wait 15 minutes post-vaccine.    Blood drawn from Right antecubital without complication on 1st attempt. Patient tolerated procedure and left in stable condition. Labs sent to [facility].

## 2019-02-14 NOTE — Progress Notes (Signed)
Subjective:      Patient ID: Joshua Meyers is a 65 y.o. male.    Chief Complaint:  Chief Complaint   Patient presents with    Annual Exam       HPI:  HPI Patient presents for annual health review and physical exam.   History of low back pain with nerve impingement at L3. Flies on planes frequently. Managed with stretching and exercise.   History of seasonal allergies managed with over the counter meds.   History of elevated Lp(a). Heart disease in father. Had stress test in the past.   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 No recurrence.     Current: Working entirely remotely. Occasionally will go to a studio downtown.   Took one trip to [address], multiple covid tests administered through the trip.     Cardio: Has episodes of palpitations. Thinks they are more frequent than before. Used to only notice before going to bed, but now noticing other times. Not present with exercise. No particular rhythm to the beats, feels it as a fluttering, sometimes will cough once and it stops.     Left inguinal hernia: repaired with mesh 01/13/2018. Took a while to heal but doing ok now.   .   Plantar fasciitis in right heel in 2019: resolved.     Skin:just saw derm Dr. Jason Fila and had lesion removed from chest. Has multiple pigmented nevi.     Joint pain: feels pain and stiffness at bases of thumbs. Put topical lidocaine on them and that helps. Bother him every day. Father had arthritis. Has hx of thumb fx 4-5 times so has some limitation of movement. No redness or swelling in area.     Back pain: nagging pain behind right shoulder blade. Will flare when he travels a lot. Then will improve. Now sitting more at home desk than he used to and pain is worse. Will do a ball massage, yoga. If sleeps funny on it, will get big knot. Would usually get acup or PT but not getting out these days. Worse with long car rides.     Hay fever: less this year than in the past.     Ear:  intermittent sharp pain in ear when blows nose. Has had in the past with flying. Resolves immediately. Has some relationship to congestion. Always the right ear.     Hx of allergies: takes meds sparingly, uses claritin infrequently. Has been much better since [employer].       Review of labs:CBC, UA, TFTs, PSA normal.   Chem panel shows fasting glucose 92, AST and ALT mildly elevated  Vit D and B12 lower than optimal.   A1c good at 5.0, was 5.2 in 2019  Chol 206, HDL 60, trig 56, LDL 131, unchanged   LDLp, smalls slightly down, Mediums slightly up.   HDL large slightly better.   Peak size same, A pattern.   Lp(a) slightly improved, still high. Apo B mildly elevated.     ASCVD 10-year risk: 10% intermediate. Cardio referral for bradycardia. Will work on BP    Exercise:Weight lifting 2 days a week. Runs 4 days a week.Yoga multiple times a week.  Played more golf this year than usual.     Sleep: Sleeps well, even with multiple overseas trips. Does not need any sleep meds or melatonin. Sleeps very deeply and has constantly changing time zones. Sleep times vary depending on schedule, never  regular.     Diet: Follows low lectin Gundry diet and has for 2-3 years. Does well for 5 days a week and enjoys himself on the weekend. Takes some supplements for dietary coverage. Uses plant based fiber that Gundry recommends. Takes this daily.   Takes protein shake on Sat and Sunday to try and build muscle. Feels stronger.  Tried intermittent fasting in the past and got some heart flutters so stopped it.   Weight is always between 175 and 180 pounds.     Stress adaptation:Morning in hot tub and gets relaxed, 30 minutes of yoga every morning and weights. Days have gotten more busy/jammed than they have been in the past. Took no time off in 2019. May not be managing stress as well as in the past.     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 No time off in Covid. Tired on Fridays. Zoom all day long.     Social: 2  drinks a night in 2019. Off alcohol in January. Feels better.      Health maintenance:  Colon cancer screening:No family hx.Cologuard negative 2019   Screening CT of chest if hx smoking:n/a  Hepatitis C screening: neg 08/2017  Dental visit: utd, had broken tooth, infected, bone graft and awaiting implant  Eye exam: overdue, needs glaucoma check.     Immunizations:  Tetanus: 2015 Tdap, dtr is about to deliver first grandchild and he has been requested to get booster vaccine for pertussis coverage.   Prevnar:n/a  Pneumovax:n/a  Shingrix: needs, will start after Tdap and covid vaccine.   Flu: done 2020    Advanced directive: Advanced directive discussed.  Has an Scientist, water quality. A copy has not been provided. Requested to provide.    PFTs:normal 2019, not repeated due to covid restrictions.     Hearing:normal    Vision:deferred    EKG:2019, sinus brady at 42, slower than in past. NSTwave change. Asymptomatic, referred for cardio eval.     BIA: Wt up 1 pound, SMM down 1 pound, PBF up but still 18.1  Visceral fat up from 5 to 6.      Epworth Sleepiness Scale:8  STOP BANG:2    Problem List:  Patient Active Problem List   Diagnosis    History of Graves' disease    Seasonal allergies    Elevated Lp(a)    Family history of heart disease    Vitamin D deficiency    Pure hypercholesterolemia    Vitamin B12 deficiency    Benign prostatic hyperplasia with weak urinary stream    Primary osteoarthritis of both first carpometacarpal joints       Current Medications:  Current Outpatient Medications   Medication Sig Dispense Refill    Ascorbic Acid (VITAMIN C) 1000 MG tablet Take 1,000 mg by mouth daily      aspirin EC 81 MG EC tablet Take 81 mg by mouth daily         CALCIUM-MAGNESIUM PO Take 1 capsule by mouth daily      Cholecalciferol (VITAMIN D3 PO) Take 10,000 IU by mouth daily      Coenzyme Q10 (CoQ-10) 100 MG Cap Take 1 capsule by mouth daily      COLOSTRUM PO Take 1,000 mg by mouth twice a week          ECHINACEA PO Take 400 mg by mouth as needed         Fiber Powder Take 1 scoop. by mouth daily Prebio Thrive  Glucosamine HCl-MSM (GLUCOSAMINE-MSM PO) Take 1 capsule by mouth daily      loratadine (CLARITIN) 10 MG tablet Take 10 mg by mouth as needed for Allergies      Multiple Vitamin (MULTIVITAMIN) capsule Take 1 capsule by mouth daily Centrum Silver         Omega-3 Fatty Acids (OMEGA 3 PO) Take 1,000 mg by mouth daily          Potassium Citrate 99 MG Cap Take 1 capsule by mouth daily      Taurine 1000 MG Cap Take 1 capsule by mouth daily      TURMERIC PO Take 1,600 mg by mouth daily         UNABLE TO FIND Take 1 scoop. by mouth daily Med Name: Vital Reds (contains polyphenols 1760mg , Metabolic enhancing blend 365 mg, Digestive support blend 3 billion CFUs        UNABLE TO FIND Take 1 Scoop by mouth daily Med Name: Primal Plants: greens blend 1,000 mg, Metbolic enhancing 415mg , Digestive support 1350 mg, Probiotic blend 3 billion CFU      UNABLE TO FIND Take 1 capsule by mouth daily Med Name: Peak Mobility       No current facility-administered medications for this visit.        Allergies:  Allergies   Allergen Reactions    Dust Mite Mixed Allergen Ext [Mite (D. Farinae)]     Other      Ragweed, seasonal allergies, animal dander       Past Medical History:  Past Medical History:   Diagnosis Date    Ankle fracture 1960s    Right ankle    Ankle fracture, left 1977    Arrhythmia 2001    dt Graves disease which resolved; Afib per [facility] history    Fracture     bilat ankles childhood    Graves disease 2001    Treated 2001    Hamstring injury 2001    Hernia, inguinal 2017    Left side    Hyperthyroidism 2001    Graves disease for 18 months; resolved on its own    Inguinal hernia 2019    left side    Pinched nerve     L3    Plantar fasciitis of right foot 2019    Sciatica 2016    Unilateral inguinal hernia without obstruction or gangrene, recurrence not specified 12/16/2017       Past Surgical  History:  Past Surgical History:   Procedure Laterality Date    HERNIA REPAIR      HERNIORRHAPHY, INGUINAL Left 12/16/2017    Procedure: HERNIORRHAPHY, REPAIR DIRECT INGUINAL WITH MESH;  Surgeon: Winona Legato, MD;  Location: [facility];  Service: General;  Laterality: Left;    SKIN BIOPSY      chest    TONSILLECTOMY  age 19    TOOTH EXTRACTION Left 03/2018    left upper molar    VASECTOMY  1992    VASECTOMY  1992    VASECTOMY      25 yrs ago    WISDOM TOOTH EXTRACTION  age 49       Family History:  Family History   Problem Relation Age of Onset    Cancer Mother         Bladder    Stroke Mother     Heart disease Father     Dementia Father     Melanoma Father     No known  problems Sister     No known problems Brother     Diabetes type I Daughter     Thyroid disease Daughter         s/p ablation    No known problems Son     Obesity Maternal Grandmother         overweight    No known problems Maternal Grandfather     Alzheimer's disease Paternal Grandmother     Heart disease Paternal Grandfather     Heart attack Paternal Grandfather        Social History:  Social History     Socioeconomic History    Marital status: Married     Spouse name: Not on file    Number of children: Not on file    Years of education: Not on file    Highest education level: Not on file   Occupational History    Not on file   Social Needs    Financial resource strain: Not on file    Food insecurity     Worry: Not on file     Inability: Not on file    Transportation needs     Medical: Not on file     Non-medical: Not on file   Tobacco Use    Smoking status: Never Smoker    Smokeless tobacco: Never Used    Tobacco comment: occasional cigar   Substance and Sexual Activity    Alcohol use: Yes     Alcohol/week: 0.0 - 14.0 standard drinks     Comment: wine or martiini, 2 drink daily; no drinking in January    Drug use: Never    Sexual activity: Not on file   Lifestyle    Physical activity     Days per  week: Not on file     Minutes per session: Not on file    Stress: Not on file   Relationships    Social connections     Talks on phone: Not on file     Gets together: Not on file     Attends religious service: Not on file     Active member of club or organization: Not on file     Attends meetings of clubs or organizations: Not on file     Relationship status: Not on file    Intimate partner violence     Fear of current or ex partner: Not on file     Emotionally abused: Not on file     Physically abused: Not on file     Forced sexual activity: Not on file   Other Topics Concern    Not on file   Social History Narrative    ** Merged History Encounter **            The following sections were reviewed this encounter by the provider:   Tobacco   Allergies   Meds   Problems   Med Hx   Surg Hx   Fam Hx          ROS:  Review of Systems   Constitutional: Negative.         High stress   HENT: Positive for congestion and ear pain.    Eyes: Negative.    Respiratory: Negative.    Cardiovascular: Positive for palpitations.   Endocrine: Negative.    Genitourinary:        Decreased urinary stream   Musculoskeletal: Positive for arthralgias.  Base of thumbs   Skin:        multiple pigmented lesions   Allergic/Immunologic: Positive for environmental allergies.   Neurological: Negative.    Hematological: Negative.    Psychiatric/Behavioral: Negative.    All other systems reviewed and are negative.      Vitals:  BP 128/82 (BP Site: Left arm, Patient Position: Sitting, Cuff Size: Medium)    Pulse (!) 39    Temp 97.1 F (36.2 C) (Temporal)    Resp 16    Ht 1.791 m (5' 10.5")    Wt 81.6 kg (180 lb)    SpO2 97%    BMI 25.46 kg/m      Objective:     Physical Exam:  Physical Exam General Appearance: NAD, pleasant  HEENT: Eyes: clear  Nose: clear  Tympanic Membranes: normal  Pharynx: clear  Oral cavity: no lesions, dentition in good repair, one gap awaiting implant  Neck: supple without lymphadenopathy  Thyroid: normal size and  consistency  Heart: RRR without murmur, gallop, or rub  Lungs: clear to auscultation  Abdomen: soft, non-tender, non-distended, no masses palpated, no hepatosplenomegaly  Neurologic exam: PERRL, no focal signs, DTRs 2+ and symmetric  Skin: normal color and temp, multiple seborrheic keratoses  Peripheral pulses: DP and PT 2+, equal bilaterally  Extremities: no clubbing, no edema, proximal 1st metacarpals bilaterally show bony enlargement, right worse than left. Right hand dominant. No redness or soft tissue swelling, tender to deep palpation at medial palmar aspect of bony enlargements.   Genitalia: normal male without lesions; testes NT and without masses;hernia repaired  Rectal: no masses, no hemorrhoids  Prostate: normal size and consistency, no nodules, no enlargement.          Assessment:     Routine adult health maintenance    Pure hypercholesterolemia    Vitamin D deficiency    Family history of heart disease    Elevated Lp(a)    History of Graves' disease    Vitamin B12 deficiency    Immunization due  -     Tdap vaccine greater than or equal to 7yo IM    Elevated LFTs  -     Comprehensive metabolic panel    Sinus bradycardia by electrocardiogram  -     EKG SCAN  -     Cardiology Referral: Casper Harrison, MD ([facility] [place])    Palpitations  -     EKG SCAN  -     Cardiology Referral: Casper Harrison, MD ([facility])    Benign prostatic hyperplasia with weak urinary stream    Muscle spasm  -     Referral to Physical Therapy - EXTERNAL    Other orders  -     Hemolysis index  -     GFR          Plan:     Increase vitamin D3 by adding 3 extra doses of 5000IU vitamin D3 per week. In other words, 3 days a week, take 2 pills instead of one. Recheck 3 months.     Start vitamin B12 2000 mcg dissolved under the tongue once daily. Use methylcobalamine, hydroxycobalamine, or adenosylcobalamine. (NOT cyanocobalamine). Recheck in 3 months.    Cholesterol: Try to reduce sugars and alcohol  in your diet to improve cholesterol.   BP: check BP at home 2 times a day and send me readings after one week.     Repeat lab to look at liver function  tests today.     See the cardiologist to evaluate low heart rate, conduction delay and palpitations. Referral provided for Dr. Kathrin Greathouse or any of the partners at [facility].     Tdap today. Have the covid vaccine when available. Then you can have the Shingrix vaccine here starting about one month after you complete the covid vaccine.     For hands, can use blue emu (available at drugstores) or topical voltaren gel (over the counter) as needed.   You can also do a trial of Boswellia 400mg  3 times a day. Try it for 3 months to see if it will help keep the stiffness and pain from coming.     For muscle spasm in your back, PT and/or acupuncture. Also, get a standing desk at home to improve posture during long work hours. [facility] in [place] is being very covid-safe and they could see you for attention to the chronic muscle spasm.     For reduced urinary stream, start Perque Prost8 Vitality Guard, 4 softgels a day. Give it 3-6 months to see if it will help.     Plan to recheck vitamin D and B12 in 3 months.     Chart prep: 20 minutes  Visit time: 90 minutes  Chart completion: 15 minutes  Total time: 115 minutes  Theda Belfast, MD

## 2019-02-14 NOTE — Patient Instructions (Signed)
Increase vitamin D3 by adding 3 extra doses of 5000IU vitamin D3 per week. In other words, 3 days a week, take 2 pills instead of one. Recheck 3 months.     Start vitamin B12 2000 mcg dissolved under the tongue once daily. Use methylcobalamine, hydroxycobalamine, or adenosylcobalamine. (NOT cyanocobalamine). Recheck in 3 months.  Cholesterol:     Repeat lab to look at liver function tests today.     See the cardiologist to evaluate low heart rate, conduction delay and palpitations. Referral provided for Dr. Kathrin Greathouse or any of the partners at [facility].     Tdap today. Have the covid vaccine when available. Then you can have the Shingrix vaccine here starting about one month after you complete the covid vaccine.     For hands, can use blue emu (available at drugstores) or topical voltaren gel (over the counter) as needed.   You can also do a trial of Boswellia 400mg  3 times a day. Try it for 3 months to see if it will help keep the stiffness and pain from coming.     For muscle spasm in your back, PT and/or acupuncture. Also, get a standing desk at home to improve posture during long work hours. [facility] in [place] is being very covid-safe and they could see you for attention to the chronic muscle spasm.     Try to reduce sugars and alcohol in your diet to improve cholesterol.     For reduced urinary stream, start Perque Prost8 Vitality Guard, 4 softgels a day. Give it 3-6 months to see if it will help.     Plan to recheck vitamin D and B12 in 3 months.     Theda Belfast, MD

## 2019-02-21 ENCOUNTER — Encounter: Payer: Self-pay | Admitting: Family Medicine

## 2019-02-23 ENCOUNTER — Ambulatory Visit (INDEPENDENT_AMBULATORY_CARE_PROVIDER_SITE_OTHER): Payer: Self-pay | Admitting: Cardiology

## 2019-02-24 ENCOUNTER — Encounter: Payer: Self-pay | Admitting: Family Medicine

## 2019-04-03 ENCOUNTER — Ambulatory Visit (INDEPENDENT_AMBULATORY_CARE_PROVIDER_SITE_OTHER): Payer: Self-pay

## 2019-06-19 ENCOUNTER — Encounter: Payer: Self-pay | Admitting: Family Medicine

## 2019-09-04 ENCOUNTER — Encounter: Payer: Self-pay | Admitting: Family Medicine

## 2019-09-04 ENCOUNTER — Telehealth: Payer: Self-pay | Admitting: Family Medicine

## 2019-09-04 DIAGNOSIS — W57XXXA Bitten or stung by nonvenomous insect and other nonvenomous arthropods, initial encounter: Secondary | ICD-10-CM

## 2019-09-04 MED ORDER — DOXYCYCLINE HYCLATE 100 MG PO TABS
100.00 mg | ORAL_TABLET | Freq: Two times a day (BID) | ORAL | 0 refills | Status: AC
Start: 2019-09-04 — End: 2019-09-14

## 2019-09-04 NOTE — Telephone Encounter (Signed)
Patient & wife called regarding tick bite on patient's neck. Tick was removed on Friday, 8/6 by patient's wife. Wife confirmed she is confident the head was removed; patient cleaned the area well after removal. Site became red, swollen, and itchy this afternoon (was not irritated this morning). Red area is 1.5cm in diameter; patient denies target appearance. Patient wondering whether he needs antibiotics; advised will relay above to Dr. Okey Dupre and call him back with recommendations. Confirmed patient's pharmacy on file. Patient also to send photo of bite via MyChart.

## 2019-09-05 NOTE — Telephone Encounter (Signed)
See mychart message for management.

## 2020-02-21 ENCOUNTER — Telehealth: Payer: Self-pay | Admitting: Hospitalist

## 2020-02-21 NOTE — Telephone Encounter (Signed)
From: Joan Mayans   Sent: Wednesday, February 21, 2020 9:53 AM  To: '[email or web address]>  Subject: [place] [facility] - Annual Exam Appointment    Good Morning Mr. Joshua Meyers,    Your [place] [facility] health exam has been scheduled on Tuesday, February 27, 2020, 8:30am with Dr. Allen Norris  at our VIP [facility] [place] office, located in the [facility], [address], [place], [place] [address]. Please be prepared to spend 3 hours in our center.     Please complete the attached forms and return to me by email along with an image of your current insurance card(s) if possible, as we are trying to minimize the handling of papers within the office while still obtaining required patient information/signatures. However, please bring a government-issued picture ID and your insurance card with you.    o Authorization for Claims, Payment and Review  o Communication waiver  o Map and directions    Also, please see the attached VIP 360 Wellness Options which are covered annually through your membership as part of your annual exam. We encourage you to take advantage of this benefit of your choice!     To Prepare For Your Appointment:  o Prior to your visit, please login here and complete the eCheck-In. Here, you can verify your insurance, update medical information and sign documents.  o Do not eat or drink anything containing calories after midnight on the night prior to your exam. You may have water and calorie-free liquids, including water, black coffee, black tea or diet soda up to an hour before your exam   o Please refrain from alcohol for at least 24 hours prior to your exam.     Important special instructions for the day of your appointment:   Patients are advised to call our office at [phone] from their car upon arrival to check-in over the phone.    We are limiting visitors. Only patients needing assistance may be permitted 1 visitor to accompany them into  the clinic.    Patients and visitors are required to wear a mask/face cover into the building. Patients without a mask or the appropriate mask must wear one provided by our staff. Any patients wearing gloves will be asked to remove them upon entering the suite to limit cross-contamination. If patients prefer to wear gloves, a clean pair will be provided at their departure.         Courtesy note:  [facility] [place] [facility] schedules our team of dedicated medical professionals with each patient in order to provide to you our undivided attention. Should you need to reschedule, please provide as much notice as possible by calling [phone] at least 3 business days before the exam so another waiting patient can fill your canceled appointment.     We at [facility] look forward to seeing you!    Kindest Regards,     [facility]  Patient Access Associate 3  [place] [facility]   [address] [place], [address]   [place], [place] [address]  (T) [phone] (F) [phone]

## 2020-02-23 ENCOUNTER — Other Ambulatory Visit: Payer: Self-pay | Admitting: Hospitalist

## 2020-02-23 DIAGNOSIS — N401 Enlarged prostate with lower urinary tract symptoms: Secondary | ICD-10-CM

## 2020-02-23 DIAGNOSIS — R3912 Poor urinary stream: Secondary | ICD-10-CM

## 2020-02-23 DIAGNOSIS — Z8249 Family history of ischemic heart disease and other diseases of the circulatory system: Secondary | ICD-10-CM

## 2020-02-23 DIAGNOSIS — E538 Deficiency of other specified B group vitamins: Secondary | ICD-10-CM

## 2020-02-23 DIAGNOSIS — Z0000 Encounter for general adult medical examination without abnormal findings: Secondary | ICD-10-CM

## 2020-02-23 DIAGNOSIS — E7800 Pure hypercholesterolemia, unspecified: Secondary | ICD-10-CM

## 2020-02-23 DIAGNOSIS — Z8639 Personal history of other endocrine, nutritional and metabolic disease: Secondary | ICD-10-CM

## 2020-02-23 DIAGNOSIS — E559 Vitamin D deficiency, unspecified: Secondary | ICD-10-CM

## 2020-02-23 NOTE — Progress Notes (Signed)
Entering orders for annual physical exam. Will also need 12 lead EKG

## 2020-02-23 NOTE — Addendum Note (Signed)
Addended by: Allen Norris on: 02/23/2020 04:53 PM     Modules accepted: Orders

## 2020-02-27 ENCOUNTER — Ambulatory Visit (INDEPENDENT_AMBULATORY_CARE_PROVIDER_SITE_OTHER): Payer: No Typology Code available for payment source | Admitting: Hospitalist

## 2020-02-27 ENCOUNTER — Encounter: Payer: Self-pay | Admitting: Hospitalist

## 2020-02-27 VITALS — BP 136/82 | HR 46 | Temp 98.7°F | Resp 16 | Ht 70.8 in | Wt 182.7 lb

## 2020-02-27 DIAGNOSIS — N401 Enlarged prostate with lower urinary tract symptoms: Secondary | ICD-10-CM

## 2020-02-27 DIAGNOSIS — E538 Deficiency of other specified B group vitamins: Secondary | ICD-10-CM

## 2020-02-27 DIAGNOSIS — R3912 Poor urinary stream: Secondary | ICD-10-CM

## 2020-02-27 DIAGNOSIS — Z8639 Personal history of other endocrine, nutritional and metabolic disease: Secondary | ICD-10-CM

## 2020-02-27 DIAGNOSIS — E559 Vitamin D deficiency, unspecified: Secondary | ICD-10-CM

## 2020-02-27 DIAGNOSIS — Z0000 Encounter for general adult medical examination without abnormal findings: Secondary | ICD-10-CM

## 2020-02-27 DIAGNOSIS — Z8249 Family history of ischemic heart disease and other diseases of the circulatory system: Secondary | ICD-10-CM

## 2020-02-27 DIAGNOSIS — Z23 Encounter for immunization: Secondary | ICD-10-CM

## 2020-02-27 DIAGNOSIS — E7800 Pure hypercholesterolemia, unspecified: Secondary | ICD-10-CM

## 2020-02-27 LAB — HEMOLYSIS INDEX: Hemolysis Index: 3 (ref 0–24)

## 2020-02-27 LAB — HEPATIC FUNCTION PANEL
ALT: 32 U/L (ref 0–55)
AST (SGOT): 30 U/L (ref 5–34)
Albumin/Globulin Ratio: 1.5 (ref 0.9–2.2)
Albumin: 4.2 g/dL (ref 3.5–5.0)
Alkaline Phosphatase: 54 U/L (ref 37–117)
Bilirubin Direct: 0.2 mg/dL (ref 0.0–0.5)
Bilirubin Indirect: 0.6 mg/dL (ref 0.2–1.0)
Bilirubin, Total: 0.8 mg/dL (ref 0.2–1.2)
Globulin: 2.8 g/dL (ref 2.0–3.7)
Protein, Total: 7 g/dL (ref 6.0–8.3)

## 2020-02-27 LAB — CBC AND DIFFERENTIAL
Absolute NRBC: 0 10*3/uL (ref 0.00–0.00)
Basophils Absolute Automated: 0.05 10*3/uL (ref 0.00–0.08)
Basophils Automated: 0.8 %
Eosinophils Absolute Automated: 0.19 10*3/uL (ref 0.00–0.44)
Eosinophils Automated: 3.2 %
Hematocrit: 44.3 % (ref 37.6–49.6)
Hgb: 15.2 g/dL (ref 12.5–17.1)
Immature Granulocytes Absolute: 0.04 10*3/uL (ref 0.00–0.07)
Immature Granulocytes: 0.7 %
Lymphocytes Absolute Automated: 1.37 10*3/uL (ref 0.42–3.22)
Lymphocytes Automated: 22.8 %
MCH: 31 pg (ref 25.1–33.5)
MCHC: 34.3 g/dL (ref 31.5–35.8)
MCV: 90.2 fL (ref 78.0–96.0)
MPV: 11.2 fL (ref 8.9–12.5)
Monocytes Absolute Automated: 0.47 10*3/uL (ref 0.21–0.85)
Monocytes: 7.8 %
Neutrophils Absolute: 3.89 10*3/uL (ref 1.10–6.33)
Neutrophils: 64.7 %
Nucleated RBC: 0 /100 WBC (ref 0.0–0.0)
Platelets: 213 10*3/uL (ref 142–346)
RBC: 4.91 10*6/uL (ref 4.20–5.90)
RDW: 12 % (ref 11–15)
WBC: 6.01 10*3/uL (ref 3.10–9.50)

## 2020-02-27 LAB — LIPID PANEL
Cholesterol / HDL Ratio: 4.2
Cholesterol: 209 mg/dL — ABNORMAL HIGH (ref 0–199)
HDL: 50 mg/dL (ref 40–9999)
LDL Calculated: 140 mg/dL — ABNORMAL HIGH (ref 0–99)
Triglycerides: 96 mg/dL (ref 34–149)
VLDL Calculated: 19 mg/dL (ref 10–40)

## 2020-02-27 LAB — HEMOGLOBIN A1C
Average Estimated Glucose: 85.3 mg/dL
Hemoglobin A1C: 4.6 % (ref 4.6–5.9)

## 2020-02-27 LAB — URINALYSIS REFLEX TO MICROSCOPIC EXAM - REFLEX TO CULTURE
Bilirubin, UA: NEGATIVE
Blood, UA: NEGATIVE
Glucose, UA: NEGATIVE
Ketones UA: NEGATIVE
Leukocyte Esterase, UA: NEGATIVE
Nitrite, UA: NEGATIVE
Protein, UR: NEGATIVE
Specific Gravity UA: 1.015 (ref 1.001–1.035)
Urine Bacteria: NONE SEEN /hpf
Urine pH: 6 (ref 5.0–8.0)
Urobilinogen, UA: 0.2 (ref 0.2–2.0)

## 2020-02-27 LAB — BASIC METABOLIC PANEL
Anion Gap: 7 (ref 5.0–15.0)
BUN: 18 mg/dL (ref 9.0–28.0)
CO2: 27 mEq/L (ref 21–29)
Calcium: 9.6 mg/dL (ref 8.5–10.5)
Chloride: 105 mEq/L (ref 100–111)
Creatinine: 1.2 mg/dL (ref 0.5–1.5)
Glucose: 100 mg/dL (ref 70–100)
Potassium: 4.4 mEq/L (ref 3.5–5.1)
Sodium: 139 mEq/L (ref 136–145)

## 2020-02-27 LAB — VITAMIN B12: Vitamin B-12: 581 pg/mL (ref 211–911)

## 2020-02-27 LAB — VITAMIN D,25 OH,TOTAL: Vitamin D, 25 OH, Total: 45 ng/mL (ref 30–100)

## 2020-02-27 LAB — PROSTATE SPECIFIC ANTIGEN SCREEN: Prostate Specific Antigen Screen: 0.693 ng/mL (ref 0.000–4.000)

## 2020-02-27 LAB — URIC ACID: Uric acid: 7.2 mg/dL (ref 3.6–9.7)

## 2020-02-27 LAB — C-REACTIVE PROTEIN HIGH SENSITIVE: C-Reactive Protein, High Sensitive: 0.05 mg/dL (ref 0.00–0.50)

## 2020-02-27 LAB — THYROID STIMULATING HORMONE (TSH), REFLEX ON ABNORMAL TO FREE T4, SERUM: TSH, Abn Reflex to Free T4, Serum: 2.15 u[IU]/mL (ref 0.35–4.94)

## 2020-02-27 LAB — GFR: EGFR: >60

## 2020-02-27 MED ORDER — TAMSULOSIN HCL 0.4 MG PO CAPS
0.4000 mg | ORAL_CAPSULE | Freq: Every day | ORAL | 3 refills | Status: DC
Start: 2020-02-27 — End: 2021-03-10

## 2020-02-27 NOTE — Patient Instructions (Signed)
1. Let us keep an eye on your blood pressure. If you don't already have one, please get an [employer] home monitor - arm cuff, not wrist. Check your blood pressure when you are relaxed, about three times a week and keep a log.     2. Given your urinary symptoms, I would like you to start a prostate medication - start Tamsulosin. See if your symptoms improve. It should be taken in the evening with dinner.    3. Please go ahead and schedule Calcium score for the heart - this will help risk stratify you for heart disease risk.    4. Ok to go ahead and take a coated 81 mg Aspirin daily.     5. I recommend a colonoscopy over this year - please schedule at your convenience.    6. I would like to see you back in 3 months - or after the calcium score is done to discuss further options about risk reduction.     7. Stay well!

## 2020-02-27 NOTE — Progress Notes (Signed)
Colonoscopy request referral is emailed to [employer] and she will contact patient to schedule.

## 2020-02-27 NOTE — Progress Notes (Signed)
Fitness Evaluation:    Body Fat as percentage: In men, over 25% is obese, 20-25% is higher than normal, 16-20% is healthy / normal, <16% or under is considered lean / ideal.   2022= 20.2%   2021= 18.1%     Visceral Fat: Abdominal "belly" fat.Visceral fat is a type of body fat that exists in the abdomen and surrounds the internal organs. Everyone has some, especially those who are sedentary, chronically stressed, or maintain unhealthy diets. A different type of fat -- subcutaneous fat -- which builds up under the skin, has less of a negative impact on health and is easier to lose than visceral fat. A high level of visceral fat can increase your risk for serious health problems including cardiovascular disease, types 2 diabetes, and increased blood pressure.     2022= 7 (normal <10).    2021 = 6    Current Exercise Program:    running and strength yoga    Audiology test: Results reviewed with patient in detail.     Vision test: patient sees eye doctor yearly      Patient Education: Reviewed InBody with patient. Noted that skeletal muscle mass, percent body fat and visceral fat are all within healthy range, encouraged to continue yoga and running to maintain body strength and flexibility, and cardiovascular health.      Fitness Goals:  Maintain muscle mass  Maintain cardio health      Testing:  Labs drawn from left [facility] without any problems.  Labs are sent to [facility].    Shingrix vaccine is administered in right deltoid via aseptic technique, patient tolerated well, received VIS and side effects education.      Instructed to apply a cold compression to injection site if soreness, redness, itching or swelling occurs.     Instructed to take tylenol or motrin if soreness occurs or mild flu like symptoms.    Informed to contact the office if redness, swelling or soreness persists after 3 days.  2nd Shingrix is scheduled.

## 2020-02-27 NOTE — Progress Notes (Signed)
Chief Complaint   Patient presents with    Annual Exam       History of Present Illness    Joshua Meyers is a 65 y.o. male here for evaluation and treatment of the following concerns: Annual Exam.    He feels well and healthy. He remains active. 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 He states that work has always been stressful for him. The pandemic has cut down on travel and able to Zoom from home - he feels he is handling it ok. He used to travel a lot and all over, but has not needed to do so lately. Overall, he feels well. He lives with wife. He has a daughter, one new 72 month old grandchild and a son who are also all local.    DIET - Dr. Dutch Quint low lectin diet. Lots of vegetables. Also mindful of being sure to have protein, red meat in moderation, tries to add fish as much as he can. Does have a sweet tooth but has cut it down. Does like salt and endorses a high salt diet. Tries to limit ETOH to no more than one cocktail and one glass of wine. Feels he was doing more in 2020 with the shutdown but has done better in 2021.     EXERCISE: Used to be a runner. Lately, doing strength yoga - 4-5 times a week for 30-40 minutes. Would like to add back running once a week but not done so lately. Had several ankle issues and plantar fascitis but no issues at this time. Did injure right shoulder about 4-6 months ago - pulled by son's [place] while walking - does not limit what he does but dull ache on anterior shoulder at night. No swelling.     SLEEP: Sleeping well 8-9 hours daily. Epworth is 5. No snoring.    ROS as below. Other issues we discussed were; frequent urination - he states that he does drink a lot of water and coffee in the morning - he has to urinate about 4-6 times in the morning. There is no  incontinence, no hesitancy, no sensation of incomplete voiding. Nocturia is about 1x about 3-4 times per week. No difficulty with intercourse, libido is not as much as it used to but has not noticed any erectile dysfunction. Hernia repair has not been an issue. Drinks a lot of water out of habit but no due to polydipsia.     Allergies have not been an issue.    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    Family history of heart disease 08/11/2017    Vitamin D deficiency 08/11/2017     Past Medical History:   Diagnosis Date    Ankle fracture 1960s    Right ankle    Ankle fracture, left 1977    Arrhythmia 2001    dt Graves disease which resolved; Afib per [facility] history    Fracture     bilat ankles childhood  Graves disease 2001    Treated 2001    Hamstring injury 2001    Hernia, inguinal 2017    Left side    Hyperthyroidism 2001    Graves disease for 18 months; resolved on its own    Inguinal hernia 2019    left side    Pinched nerve     L3    Plantar fasciitis of right foot 2019    Sciatica 2016    Unilateral inguinal hernia without obstruction or gangrene, recurrence not specified 12/16/2017     Past Surgical History:   Procedure Laterality Date    HERNIA REPAIR      HERNIORRHAPHY, INGUINAL Left 12/16/2017    Procedure: HERNIORRHAPHY, REPAIR DIRECT INGUINAL WITH MESH;  Surgeon: Winona Legato, MD;  Location: [facility];  Service: General;  Laterality: Left;    SKIN BIOPSY      chest    TONSILLECTOMY  age 35    TOOTH EXTRACTION Left 03/2018    left upper molar    VASECTOMY  1992    VASECTOMY  1992    VASECTOMY      25 yrs ago    WISDOM TOOTH  EXTRACTION  age 82     Immunization History   Administered Date(s) Administered    COVID-19 mRNA vaccine 12 years and above [employer]) 30 mcg/0.3 mL 03/30/2019, 04/20/2019, 12/13/2019    Hepatitis A ( Adult) 06/30/2013, 01/06/2018    Influenza Vacc Quad MDCK cell cult 4 yrs & up 01/09/2019    Influenza quadrivalent (IM) PF 3 Yrs & greater 01/06/2018    Meningococcal Polysaccharide 06/30/2013    Tdap 06/30/2013, 02/14/2019    Typhoid, NOS 06/30/2013    Yellow Fever 06/30/2013    Zoster [facility]) Vaccine Recombinant 02/27/2020     Health Maintenance   Topic Date Due    Advance Directive on File  Never done    Shingrix Vaccine 50+ (2) 04/23/2020    Colon Cancer Screening Cologuard  12/14/2020    DEPRESSION SCREENING  02/26/2021    Annual Exam  02/26/2021    HEPATITIS C SCREENING  Completed    COVID-19 Vaccine  Completed    INFLUENZA VACCINE  Addressed   :  Current Outpatient Medications   Medication Sig Dispense Refill    Ascorbic Acid (VITAMIN C) 1000 MG tablet Take 1,000 mg by mouth daily      CALCIUM-MAGNESIUM PO Take 1 capsule by mouth daily      Cholecalciferol (VITAMIN D3 PO) Take 5,000 IU by mouth daily         Coenzyme Q10 (CoQ-10) 100 MG Cap Take 1 capsule by mouth daily      COLOSTRUM PO Take 1,000 mg by mouth twice a week         ECHINACEA PO Take 400 mg by mouth as needed         Fiber Powder Take 1 scoop. by mouth daily Prebio Thrive      Glucosamine HCl-MSM (GLUCOSAMINE-MSM PO) Take 1 capsule by mouth daily      loratadine (CLARITIN) 10 MG tablet Take 10 mg by mouth as needed for Allergies      Multiple Vitamin (MULTIVITAMIN) capsule Take 1 capsule by mouth daily Centrum Silver         Omega-3 Fatty Acids (OMEGA 3 PO) Take 1,000 mg by mouth daily          Potassium Citrate 99 MG Cap Take 1 capsule by mouth daily  Taurine 1000 MG Cap Take 1 capsule by mouth daily      UNABLE TO FIND Take 1 scoop. by mouth daily Med Name: Vital Reds (contains polyphenols 1760mg ,  Metabolic enhancing blend 365 mg, Digestive support blend 3 billion CFUs        UNABLE TO FIND Take 1 Scoop by mouth daily Med Name: Primal Plants: greens blend 1,000 mg, Metbolic enhancing 415mg , Digestive support 1350 mg, Probiotic blend 3 billion CFU      tamsulosin (FLOMAX) 0.4 MG Cap Take 1 capsule (0.4 mg total) by mouth Daily after dinner 90 capsule 3     No current facility-administered medications for this visit.     Allergies   Allergen Reactions    Dust Mite Mixed Allergen Ext [Mite (D. Farinae)]     Other      Ragweed, seasonal allergies, animal dander     Social History     Socioeconomic History    Marital status: Married    Number of children: 2   Tobacco Use    Smoking status: Never Smoker    Smokeless tobacco: Never Used    Tobacco comment: occasional cigar once or twice a month   Vaping Use    Vaping Use: Never used   Substance and Sexual Activity    Alcohol use: Yes     Alcohol/week: 0.0 - 14.0 standard drinks     Comment: wine or martiini, 2 drink daily; no drinking in January    Drug use: Never   Social History Narrative    ** Merged History Encounter **          Family History   Problem Relation Age of Onset    Cancer Mother         Bladder    Stroke Mother     Heart disease Father     Dementia Father     Melanoma Father     No known problems Sister     No known problems Brother     Diabetes type I Daughter     Thyroid disease Daughter         s/p ablation    No known problems Son     Obesity Maternal Grandmother         overweight    No known problems Maternal Grandfather     Alzheimer's disease Paternal Grandmother     Heart disease Paternal Grandfather     Heart attack Paternal Grandfather        Review of Systems  CONST: No weight change, no fevers/chills sweats, fatigue, muscle aches  EYES: No blurry vision, double vision, loss of peripheral vision (overdue for an eye exam - plans to go this year)  EARS: No hearing loss, tinnitus, pain or discharge.  NOSE: No  congestion, runny nose or bloody nose  MOUTH: No sore throat, oral lesions, difficulty swallowing  NECK: No swollen glands, stiffness or pain  CV: No CP, palpitations, leg swelling, changes in exercise tolerance  RESP: No SOB, cough, wheeze, asthma  GI: No n/v, diarrhea, constipation, abd pain, heartburn, blood in stool. Regular BM.  MALE GU: No dysuria, + frequency, no hematuria and as per HPI  MSK:  No joint or muscle pain, swelling or weakness except for R shoulder as above.  SKIN:   No rashes lumps, sores or concerning moles. (saw Dr. Jason Fila last year)  ENDO:  No heat/cold intolerance, polyuria, polydipsia, polyphagia  HEME:  No bruising/bleeding, swollen glands  NEURO:  No  dizziness, LOC, numbness/tingling, weakness. Mild headache on the back of his head in the morning sometimes.  PSYCH:  No depressed mood, anhedonia, anxiety    Physical Exam:  BP 136/82 (BP Site: Right arm, Patient Position: Sitting, Cuff Size: Medium)    Pulse (!) 46    Temp 98.7 F (37.1 C) (Oral)    Resp 16    Ht 1.798 m (5' 10.8")    Wt 82.9 kg (182 lb 11.2 oz)    SpO2 96%    BMI 25.63 kg/m   Wt Readings from Last 3 Encounters:   02/27/20 82.9 kg (182 lb 11.2 oz)   02/23/19 81.2 kg (179 lb)   02/14/19 81.6 kg (180 lb)     GEN: well developed well nourished male in no apparent distress  HENT: NCAT, tympanic membrane normal bilaterally, no nasal congestion, oropharynx normal w no exudate or erythema  EYES: PERRL, EOMI, no pallor or scleral icterus, normal conjunctiva  NECK: supple, no thyromegaly or nodules appreciated  LYMPH: no cervical or supraclavicular LAD appreciated  CV: Bradycardic but regular, no m/r/g.  No LE edema.  2+ radial pulses present and equal.  RESP: CTAB, normal effort, no rhonchi or rales  GI: soft, nontender/ nondistended, NABS, no rebound or guarding, no evident HSM  GU: normal external male genitalia, no masses or hernia evident or palpable. Prostate exam was done due to complaints, reveals a smooth non-enlarged  prostate.  SKIN: warm, dry.  no visible rashes, few scattered macules and pigmented nevi  MSK: Normal bulk and tone, normal strength 5/5 and sensation UE / LE including R. Shoulder.   NEURO: PERRLA, EOMI, face symmetric, hearing intact to voice, palate raise, shoulder shrug and neck flexion intact, tongue midline. MAE.  PSYCH: normal mood and affect    Audiology test:  reviewed results with patient  - normal.  InBody: Reviewed results of InBody with patient - notable for BMI 25.6 Percentage Body Fat 20 and Visceral Fat 7  Epworth Sleepiness Scale: 5      Assessment/Plan: It was a pleasure meeting Mr. Stager for his annual physical. He remains active, with healthy habits and a good diet. We reviewed his medical history as well as his family history. BP is a bit elevated without prior history of hypertension. He endorsed a high salt diet - he will cut that down, monitor BP and follow up with me in about three months. Due to family history plus his personal history, lipid profile will need to be monitored as well as CAC scoring.     He should continue to remain active and continue to exercise 5 days a week.     1. Pure hypercholesterolemia  We reviewed lipid results together, LDL is 140, HDL is 50, total 209. Based on [employer] or Cardiology, [facility] calculator - 10 year ASCVD risk is about 13.5 percent. Statin therapy will lower that risk. Understandably, Mr. Kearse is reluctant about statin therapy. His diet is overall excellent in terms of vegetables and fish and lean protein. He should cut down on salt however. He is already active and exercises regularly.     Given family history of CAD, will get CAC scoring which will help better stratify and help decision making about statin. CRP is not elevated. Other labs are all within normal limits and we reviewed together.   - Lipid panel  - Uric acid  - CRP, High Sensitive  - Hepatic function panel (LFT)  - CT Cardiac Scoring; Future  2. Family history of heart disease  and baseline bradycardia  - ECG 12 lead today is sinus brady at 38bpm. He has had long standing bradycardia - he used to be a runner. He has no symptoms of syncope or near syncope. He has had evaluation with Dr. Kathrin Greathouse in 2021 regarding bradycardia. Had zio monitor. He does feel the PVCs at that time were in association with topical lidocaine use for his thumbs - they stopped after stopping it. He has no current symptoms or palpitations. He also has no signs of symptoms of ischemia or heart failure. Exam is unremarkable. Exercise stress test was not done in the past, but he has excellent functional capacity. D/w him to monitor BP and would like to lower BP readings to improve long term cardiovascular health.     Ok to take coated 81mg  aspirin daily - he has no h/o ulcers or bleeding. He asked this question after hearing recent new report.   - CT Cardiac Scoring; Future    3. Annual physical exam  He is up to date with other health maintenance issues. Shingles vaccine # 1 today. # 2 at a later time.    I would like him to monitor his BP and see me again in three months. Call sooner if any concerns.     - Ambulatory referral to Gastroenterology - recommend colonoscopy for colon cancer screening.    4. Vitamin B12 deficiency  - Vitamin B12 levels are normal.    5. Vitamin D deficiency  - Vitamin D,25 OH, Total levels are normal.     6. Frequent urination  Prostate is not enlarged on exam. Symptoms are suggestive of prostate enlargement although not felt on exam. Urine is negative. No evidence of diabetes. PSA is normal.    Trial of Tamsulosin recommended. Medication sent to pharmacy.     7. History of Graves' disease  TSH is normal.     8. Need for zoster vaccine  - Zoster Vaccine Recomb,Adjuvanted (IM)    9. Right shoulder injury  Mild anterior pain, no limitation in ROM, no instability. Occurred when pulled by a dog - monitor and let me know if not improved.     10. D/w him that I recommend an annual eye exam and in  his case, due to his dad's history, recommend a dedicated skin check with a dermatologist annually. He will follow up. Last year, he saw Dr. Jason Fila.    Orders Placed This Encounter   Procedures    CT Cardiac Scoring    Zoster Vaccine Recomb,Adjuvanted (IM)    GFR    Hemolysis index    Ambulatory referral to Gastroenterology       Return in about 3 months (around 05/26/2020).    Allen Norris, MD, [facility] [address]  [address]  [place], [place] [address]  P) [phone]  F) [phone]  [email or web address]

## 2020-03-27 ENCOUNTER — Telehealth: Payer: Self-pay

## 2020-03-27 ENCOUNTER — Other Ambulatory Visit: Payer: Self-pay | Admitting: Hospitalist

## 2020-03-27 DIAGNOSIS — Z8249 Family history of ischemic heart disease and other diseases of the circulatory system: Secondary | ICD-10-CM

## 2020-03-27 DIAGNOSIS — E7800 Pure hypercholesterolemia, unspecified: Secondary | ICD-10-CM

## 2020-03-27 NOTE — Telephone Encounter (Signed)
Email sent to Mr. Aime:    Dear Mr. Segreto,    I contacted [place] central scheduling. I have you scheduled for March 18th at [facility] for CT calcium score at [facility] radiology. Your appointment will appear in mychart.     Time:      March 18th, please arrive at 8:45AM  Address:   [facility] radiology                      [address], [place], [place] [address]    Preparation:  please wear loose clothes, make sure no metal on you, ok to eat and drink prior to test.      Please let me know if this time doesnt work, I can reschedule to a different time.      Regarding colonoscopy scheduling. I emailed [facility] to follow up on scheduling. It is not a urgent procedure. They might not schedule right away. I will keep you updated.     According to Dr. Lanell Matar, she would like to see you after your CT scan.  Some of the slots she has are: April 11th, 0845, April 15th, 1545, April 21st, 0845, and 1545. Let me know if any works for you. If you prefer virtual or in person visit.     Best regards,        Ronne Binning, RN, BSN   [facility] [address], [address]   [place], [place] [address]   T [phone]   F [phone]

## 2020-03-27 NOTE — Telephone Encounter (Signed)
Emailed to [employer] to follow up on scheduling colonoscopy.    Dear Ms. Dardashti,  I sent you the insurance information and referral. Please assist patient for scheduling screening colonoscopy. Please confirm [facility] take his insurance. If you need any additional information, please contact me.   Thanks very much.     Ronne Binning, RN, BSN   [facility] [address], [address]   [place], [place] [address]   T [phone]   F [phone]

## 2020-03-27 NOTE — Telephone Encounter (Signed)
Called patient's cellphone to call back, referral to [facility] for colonoscopy was emailed to [facility], I would like to follow up to see if patient has already been put on schedule, or need any additional assistance.

## 2020-04-10 ENCOUNTER — Encounter: Payer: Self-pay | Admitting: Hospitalist

## 2020-04-12 ENCOUNTER — Ambulatory Visit: Payer: No Typology Code available for payment source

## 2020-05-01 NOTE — H&P (Signed)
[facility]   [address] [place], [place] [address]           Joshua Meyers    Date of Visit:  02/23/2019  Date of Birth: 10-03-1955  Age: 65 yrs.   Medical Record Number: [id number]  Referring Physician: ROSE MD, ROSEMARIE D.  __   CURRENT DIAGNOSES     1. Nicotine Dependence, Other Tobacco Product, Uncomplicated, F17.290  2. Arrhythmia-PVCs symptomatic, I49.3  3. Bradycardia, Unspecified, R00.1  4. Arrhythmia-Palpitations,  R00.2  __  ALLERGIES    No Known Drug Allergies   __  MEDICATIONS     1. aspirin 325 mg tablet,delayed release, 1 po qd  __  CHIEF COMPLAINT/REASON  FOR VISIT  Palpitations  __  HISTORY OF PRESENT ILLNESS    The patient is a 65 year old gentleman  referred by Dr. Okey Dupre for evaluation of a bradycardia and palpitations.    The patient has enjoyed excellent health. He exercises at a high level on a daily basis and has achieved excellent conditioning effect. He is aware of a slow resting heart  rate which has been present for many years. He is entirely asymptomatic and specifically denies any lightheadedness presyncope or effort intolerance. He has continued his high level of exercise without limitations.    Recently, when going to bed,  he has noted some fluttering sensations in his chest. On occasion, it is associated with a sense of congestion and seems to clear if he coughs. He has not identified any particular triggers. Specifically, there is no association with caffeine, alcohol,  over-the-counter medications, or sleep patterns. He never notices these during the day.    Otherwise, the patient remains completely asymptomatic. Specifically, the patient denies any exertional chest pain, pressure, heaviness, tightness, squeezing  or burning sensations. The patient denies any shortness of breath, orthopnea, nocturnal dyspnea, or peripheral edema. The patient denies any palpitations, lightheadedness or syncope. Overall, the patient appears to be doing relatively well.      The  patient has no known history of heart disease and specifically denies any history of CAD, MI, CHF, Valvular Heart disease, Heart Murmurs, Dysrhythmias or Cardiomyopathy.   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  ___  FAMILY HISTORY  Father --  Heart Attack  Mother -- Atrial fibrillation    __  CARDIAC RISK FACTORS     Tobacco  Abuse: currently smoking cigars; Family History of Heart Disease: positive, Male55 y/o, Female60 y/o;  Hyperlipidemia: negative; Hypertension: positive, controlled by lifestyle;   Diabetes Mellitus: negative; Prior History of Heart Disease: negative;  Obesity: negative; Sedentary Life Style:negative;  JWJ:XBJYNWGN; Menopausal:not applicable  __   SOCIAL HISTORY    Alcohol Use: 2 drinks daily;  Smoking: smokes cigars,; Current some day smoker ([id number]); Diet: Heart Healthy and Caffeine  use-3-4 per day; Lifestyle: Married and active lifestyle; Exercise : Exercises daily, running/jogging, weight lifting and yoga; Seat Belt Use: always; Occupation : Conservator, museum/gallery; Sexual Activity: did not discuss sexual history; Residence :  lives with wife; Job Description: Field seismologist; 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[employer]; [employer];   __   REVIEW OF SYSTEMS    General: Denies recent weight loss, weight gain, fever or chills or change  in exercise tolerance.; Integumentary: Denies any change in hair or nails, rashes, or skin  lesions.;  Eyes: wears eye glasses/contact lenses; Ears, Nose, Throat, Mouth: Denies any hearing loss, epistaxis,  hoarseness or difficulty speaking.;Respiratory: Denies dyspnea, cough, wheezing or hemoptysis.; Cardiovascular : Please review HPI; Abdominal : Denies ulcer disease, hematochezia or melena.;Musculoskeletal :arthritis; Neurological : Denies any recurrent strokes, TIA, or seizure disorder.; Psychiatric : Denies any depression, substance abuse or change in cognitive functions.; Endocrine: hyperthyroidism;  Hematologic/Immunologic: food allergies, seasonal allergies  __  PHYSICAL EXAMINATION     Vital Signs:  Blood Pressure:  120/90 Sitting, Right arm, regular cuff  120/90 Sitting, Left arm, regular cuff     Weight: 179.00 lbs.  Height: 70.5"  BMI:  25.32   Pulse: 47/min.       Constitutional:  Cooperative, alert and oriented,well developed, well nourished, in no acute distress. Skin: warm and dry to touch  Head: normocephalic Eyes: conjunctivae and lids normal  ENT: No pallor or cyanosis Neck: JVP normal, no carotid bruit, thyroid not enlarged  Chest: clear to auscultation bilaterally Cardiac: Regular rhythm, S1 normal, S2 normal, No S3 or S4,  Apical impulse not displaced, no murmurs, gallops or rubs detected. Abdomen: abdomen soft, non-tender  Peripheral Pulses: pulses full and equal in all extremities Extremities/Back: No deformities,clubbing,  erythema or edema observed Psychiatric: normal memory Neurological : No gross motor or sensory deficits noted, affect appropriate, oriented to time, person and place.   __    Medications added today by the physician:    ECG:  Sinus bradycardia at 46 bpm with 1 PVC    IMPRESSIONS:  1.  Asymptomatic bradycardia  2. Nocturnal palpitations  3. High level of conditioning      RECOMMENDATIONS:  1. The patient's bradycardia has been present for several decades and is actually usually slower than noted today. This is  presumptively due to his longstanding conditioning  affect from high-level exercise on a regular basis. Since he is asymptomatic, this does not require further evaluation but given the concurrent presence of palpitations, we will place a monitor for 3  days to get a better sense of his heart rate and the potential etiology of his palpitations. I suspect that he is actually noticing the PVCs which we picked up today but since he is otherwise not distracted by day-to-day activities when he is going to  sleep, that he is aware of these beats.    I have explained the PVCs are part of normal cardiac function and unless we demonstrate an inordinate burden, it is unlikely that we will pursue any type of suppressive strategy. As long as he remains  asymptomatic, there will be no restrictions to his activities.    All of the above was carefully explained and thoroughly reviewed. All of his questions were answered and he appears understand.    2. The patient was encouraged and counselled  to follow-up regularly with their PCP for ongoing care and management, as well as health surveillance and health maintenance.     3. Thank you again for allowing me to participate in the care of this patient. Your kind referral is greatly appreciated.  If questions arise, please do not hesitate to contact me at the office (back line is [phone]), or by email, [email or web address] or my cellphone [phone].  This note was generated by the [facility] EMR system/Dragon speech recognition and  may contain errors or omissions not intended by the user. Grammatical errors, random word insertions, deletions, pronoun errors, and incomplete sentences are occasional consequences of this technology due to software limitations. Not all errors are caught  or corrected. If there are questions or concerns about the content of this note or information contained within the body of this dictation, they should be addressed directly with the author for clarification.      Alden Hipp, MD, [facility]       cc:  Gillian Shields ROSE MD  ____________________________  TODAYS ORDERS  Diet mgmt edu, guidance and counseling TODAY  Smoking cessation education TODAY  12 Lead ECG Today  Continuous Ambulatory Monitor (3 Days) 1 Day  Return  Visit 15 MIN prn

## 2020-05-21 ENCOUNTER — Ambulatory Visit (INDEPENDENT_AMBULATORY_CARE_PROVIDER_SITE_OTHER): Payer: No Typology Code available for payment source

## 2020-05-21 DIAGNOSIS — Z23 Encounter for immunization: Secondary | ICD-10-CM

## 2020-05-21 NOTE — Progress Notes (Signed)
2nd Shingrix given in right deltoid. VIS given to patient.     Patient left in good condition.      Instructed to apply a cold compression to injection site if soreness, redness, itching or swelling occurs.    Patient informed of the importance of receiving both doses of the vaccine.  Instructed to take tylenol or motrin if soreness occurs.   Informed to contact the office if redness, swelling or soreness persists after 5 days.

## 2020-12-25 ENCOUNTER — Encounter: Payer: Self-pay | Admitting: Hospitalist

## 2020-12-25 DIAGNOSIS — Z125 Encounter for screening for malignant neoplasm of prostate: Secondary | ICD-10-CM

## 2020-12-25 DIAGNOSIS — Z0000 Encounter for general adult medical examination without abnormal findings: Secondary | ICD-10-CM

## 2020-12-25 DIAGNOSIS — Z8639 Personal history of other endocrine, nutritional and metabolic disease: Secondary | ICD-10-CM

## 2020-12-25 DIAGNOSIS — N401 Enlarged prostate with lower urinary tract symptoms: Secondary | ICD-10-CM

## 2020-12-25 DIAGNOSIS — E538 Deficiency of other specified B group vitamins: Secondary | ICD-10-CM

## 2020-12-25 DIAGNOSIS — E7841 Elevated Lipoprotein(a): Secondary | ICD-10-CM

## 2020-12-25 DIAGNOSIS — E559 Vitamin D deficiency, unspecified: Secondary | ICD-10-CM

## 2020-12-25 DIAGNOSIS — E7800 Pure hypercholesterolemia, unspecified: Secondary | ICD-10-CM

## 2020-12-25 DIAGNOSIS — W57XXXD Bitten or stung by nonvenomous insect and other nonvenomous arthropods, subsequent encounter: Secondary | ICD-10-CM

## 2021-01-07 ENCOUNTER — Other Ambulatory Visit: Payer: Self-pay | Admitting: Orthopaedic Surgery

## 2021-01-07 DIAGNOSIS — M7541 Impingement syndrome of right shoulder: Secondary | ICD-10-CM

## 2021-01-07 DIAGNOSIS — M25511 Pain in right shoulder: Secondary | ICD-10-CM

## 2021-01-28 ENCOUNTER — Encounter: Payer: Self-pay | Admitting: Hospitalist

## 2021-02-06 ENCOUNTER — Ambulatory Visit (FREE_STANDING_LABORATORY_FACILITY): Payer: No Typology Code available for payment source

## 2021-02-06 DIAGNOSIS — N401 Enlarged prostate with lower urinary tract symptoms: Secondary | ICD-10-CM

## 2021-02-06 DIAGNOSIS — E7800 Pure hypercholesterolemia, unspecified: Secondary | ICD-10-CM

## 2021-02-06 DIAGNOSIS — R3912 Poor urinary stream: Secondary | ICD-10-CM

## 2021-02-06 DIAGNOSIS — Z0000 Encounter for general adult medical examination without abnormal findings: Secondary | ICD-10-CM

## 2021-02-06 DIAGNOSIS — E559 Vitamin D deficiency, unspecified: Secondary | ICD-10-CM

## 2021-02-06 DIAGNOSIS — W57XXXD Bitten or stung by nonvenomous insect and other nonvenomous arthropods, subsequent encounter: Secondary | ICD-10-CM

## 2021-02-06 DIAGNOSIS — Z8639 Personal history of other endocrine, nutritional and metabolic disease: Secondary | ICD-10-CM

## 2021-02-06 DIAGNOSIS — E7841 Elevated Lipoprotein(a): Secondary | ICD-10-CM

## 2021-02-06 DIAGNOSIS — E538 Deficiency of other specified B group vitamins: Secondary | ICD-10-CM

## 2021-02-06 DIAGNOSIS — Z125 Encounter for screening for malignant neoplasm of prostate: Secondary | ICD-10-CM

## 2021-02-06 LAB — HEPATIC FUNCTION PANEL
ALT: 30 U/L (ref 0–55)
AST (SGOT): 23 U/L (ref 5–41)
Albumin/Globulin Ratio: 1.4 (ref 0.9–2.2)
Albumin: 4.2 g/dL (ref 3.5–5.0)
Alkaline Phosphatase: 58 U/L (ref 37–117)
Bilirubin Direct: 0.4 mg/dL (ref 0.0–0.5)
Bilirubin Indirect: 0.8 mg/dL (ref 0.2–1.0)
Bilirubin, Total: 1.2 mg/dL (ref 0.2–1.2)
Globulin: 2.9 g/dL (ref 2.0–3.6)
Protein, Total: 7.1 g/dL (ref 6.0–8.3)

## 2021-02-06 LAB — CBC AND DIFFERENTIAL
Absolute NRBC: 0 10*3/uL (ref 0.00–0.00)
Basophils Absolute Automated: 0.06 10*3/uL (ref 0.00–0.08)
Basophils Automated: 0.7 %
Eosinophils Absolute Automated: 0.22 10*3/uL (ref 0.00–0.44)
Eosinophils Automated: 2.7 %
Hematocrit: 44.3 % (ref 37.6–49.6)
Hgb: 15.5 g/dL (ref 12.5–17.1)
Immature Granulocytes Absolute: 0.02 10*3/uL (ref 0.00–0.07)
Immature Granulocytes: 0.2 %
Instrument Absolute Neutrophil Count: 5.52 10*3/uL (ref 1.10–6.33)
Lymphocytes Absolute Automated: 1.62 10*3/uL (ref 0.42–3.22)
Lymphocytes Automated: 20 %
MCH: 31.5 pg (ref 25.1–33.5)
MCHC: 35 g/dL (ref 31.5–35.8)
MCV: 90 fL (ref 78.0–96.0)
MPV: 10.6 fL (ref 8.9–12.5)
Monocytes Absolute Automated: 0.66 10*3/uL (ref 0.21–0.85)
Monocytes: 8.1 %
Neutrophils Absolute: 5.52 10*3/uL (ref 1.10–6.33)
Neutrophils: 68.3 %
Nucleated RBC: 0 /100 WBC (ref 0.0–0.0)
Platelets: 261 10*3/uL (ref 142–346)
RBC: 4.92 10*6/uL (ref 4.20–5.90)
RDW: 12 % (ref 11–15)
WBC: 8.1 10*3/uL (ref 3.10–9.50)

## 2021-02-06 LAB — URINALYSIS REFLEX TO MICROSCOPIC EXAM - REFLEX TO CULTURE
Bilirubin, UA: NEGATIVE
Blood, UA: NEGATIVE
Glucose, UA: NEGATIVE
Ketones UA: NEGATIVE
Leukocyte Esterase, UA: NEGATIVE
Nitrite, UA: NEGATIVE
Protein, UR: NEGATIVE
Specific Gravity UA: 1.012 (ref 1.001–1.035)
Urine pH: 6 (ref 5.0–8.0)
Urobilinogen, UA: NORMAL mg/dL

## 2021-02-06 LAB — LYME AB, TOTAL,REFLEX TO WESTERN BLOT (IGG & IGM): Lyme AB,Total,Reflx to WB(IGM): 0.19 Index

## 2021-02-06 LAB — BASIC METABOLIC PANEL
Anion Gap: 9 (ref 5.0–15.0)
BUN: 13 mg/dL (ref 9.0–28.0)
CO2: 25 mEq/L (ref 17–29)
Calcium: 9.9 mg/dL (ref 8.5–10.5)
Chloride: 103 mEq/L (ref 99–111)
Creatinine: 1.2 mg/dL (ref 0.5–1.5)
Glucose: 97 mg/dL (ref 70–100)
Potassium: 4.1 mEq/L (ref 3.5–5.3)
Sodium: 137 mEq/L (ref 135–145)

## 2021-02-06 LAB — HEMOGLOBIN A1C
Average Estimated Glucose: 99.7 mg/dL
Hemoglobin A1C: 5.1 % (ref 4.6–5.9)

## 2021-02-06 LAB — THYROID STIMULATING HORMONE (TSH), REFLEX ON ABNORMAL TO FREE T4, SERUM: TSH, Abn Reflex to Free T4, Serum: 1.63 u[IU]/mL (ref 0.35–4.94)

## 2021-02-06 LAB — GFR: EGFR: >60

## 2021-02-06 LAB — HEMOLYSIS INDEX: Hemolysis Index: 2 Index (ref 0–24)

## 2021-02-06 LAB — MICROALBUMIN, RANDOM URINE
Urine Creatinine, Random: 119 mg/dL
Urine Microalbumin, Random: <5 ug/ml (ref 0.0–30.0)

## 2021-02-06 LAB — URIC ACID: Uric acid: 5.9 mg/dL (ref 3.6–9.7)

## 2021-02-06 LAB — VITAMIN D,25 OH,TOTAL: Vitamin D, 25 OH, Total: 55 ng/mL (ref 30–100)

## 2021-02-06 LAB — PROSTATE SPECIFIC ANTIGEN SCREEN: Prostate Specific Antigen Screen: 0.644 ng/mL (ref 0.000–4.000)

## 2021-02-06 LAB — VITAMIN B12: Vitamin B-12: 763 pg/mL (ref 211–911)

## 2021-02-06 LAB — C-REACTIVE PROTEIN HIGH SENSITIVE: C-Reactive Protein, High Sensitive: 1.16 mg/dL — ABNORMAL HIGH (ref 0.00–1.00)

## 2021-02-06 NOTE — Progress Notes (Signed)
Labs were drawn from right AC. Urine collected during this visit. Labs sent to [facility] refrigerated.     -- Patient was instructed to apply pressure onto the venipuncture site for 1-2 minutes to prevent hematoma/bruising. Patient tolerated well and was notified results would appear in MyChart.

## 2021-02-07 ENCOUNTER — Inpatient Hospital Stay
Admit: 2021-02-07 | Discharge: 2021-02-07 | Disposition: A | Discharge status: Home / Self Care | Payer: MEDICARE | Attending: Chiropractor

## 2021-02-07 ENCOUNTER — Ambulatory Visit
Admission: RE | Admit: 2021-02-07 | Discharge: 2021-02-07 | Disposition: A | Discharge status: Home / Self Care | Payer: No Typology Code available for payment source | Source: Ambulatory Visit | Attending: Orthopaedic Surgery | Admitting: Orthopaedic Surgery

## 2021-02-07 DIAGNOSIS — M25511 Pain in right shoulder: Secondary | ICD-10-CM | POA: Insufficient documentation

## 2021-02-07 DIAGNOSIS — M7541 Impingement syndrome of right shoulder: Secondary | ICD-10-CM | POA: Insufficient documentation

## 2021-02-07 DIAGNOSIS — M19011 Primary osteoarthritis, right shoulder: Secondary | ICD-10-CM | POA: Insufficient documentation

## 2021-02-07 DIAGNOSIS — M948X1 Other specified disorders of cartilage, shoulder: Secondary | ICD-10-CM | POA: Insufficient documentation

## 2021-02-07 DIAGNOSIS — M75121 Complete rotator cuff tear or rupture of right shoulder, not specified as traumatic: Secondary | ICD-10-CM | POA: Insufficient documentation

## 2021-02-07 DIAGNOSIS — M67813 Other specified disorders of tendon, right shoulder: Secondary | ICD-10-CM | POA: Insufficient documentation

## 2021-02-07 DIAGNOSIS — D696 Thrombocytopenia, unspecified: Secondary | ICD-10-CM

## 2021-02-07 LAB — COVID-19 & INFLUENZA COMBO
INFLUENZA A: NOT DETECTED
INFLUENZA B: NOT DETECTED
SARS-CoV-2 RNA, RT PCR: NOT DETECTED

## 2021-02-07 LAB — CBC WITH AUTO DIFFERENTIAL
Basophils Absolute: 0 10*3/uL (ref 0.0–0.1)
Basophils: 0.4 %
Eosinophils Absolute: 0.1 10*3/uL (ref 0.0–0.4)
Eosinophils: 3.1 %
Hematocrit: 42.2 % (ref 42.0–52.0)
Hemoglobin: 13.1 gm/dl — ABNORMAL LOW (ref 14.0–18.0)
Immature Grans (Abs): 0.02 10*3/uL (ref 0.00–0.07)
Immature Granulocytes: 0.8 %
Lymphocytes Absolute: 0.7 10*3/uL — ABNORMAL LOW (ref 1.0–4.8)
Lymphocytes: 28.6 %
MCH: 28.4 pg (ref 26.0–33.0)
MCHC: 31 gm/dl — ABNORMAL LOW (ref 32.2–35.5)
MCV: 91.5 fL (ref 80.0–94.0)
MPV: 10.2 fL (ref 9.4–12.4)
Monocytes Absolute: 0.3 10*3/uL — ABNORMAL LOW (ref 0.4–1.3)
Monocytes: 11 %
Platelets: 58 10*3/uL — ABNORMAL LOW (ref 130–400)
RBC: 4.61 10*6/uL — ABNORMAL LOW (ref 4.70–6.10)
RDW-CV: 13.8 % (ref 11.5–14.5)
RDW-SD: 46.3 fL — ABNORMAL HIGH (ref 35.0–45.0)
Seg Neutrophils: 56.1 %
Segs Absolute: 1.5 10*3/uL — ABNORMAL LOW (ref 1.8–7.7)
WBC: 2.6 10*3/uL — ABNORMAL LOW (ref 4.8–10.8)
nRBC: 0 /100 wbc

## 2021-02-07 LAB — PROTIME-INR: INR: 1.03 (ref 0.85–1.13)

## 2021-02-07 LAB — HEPATIC FUNCTION PANEL
ALT: 75 U/L — ABNORMAL HIGH (ref 11–66)
AST: 52 U/L — ABNORMAL HIGH (ref 5–40)
Albumin: 4 g/dL (ref 3.5–5.1)
Alkaline Phosphatase: 53 U/L (ref 38–126)
Bilirubin, Direct: <0.2 mg/dL (ref 0.0–0.3)
Total Bilirubin: 0.4 mg/dL (ref 0.3–1.2)
Total Protein: 7.3 g/dL (ref 6.1–8.0)

## 2021-02-07 LAB — IRON SATURATION: Iron Saturation: 17 % — ABNORMAL LOW (ref 20–50)

## 2021-02-07 LAB — FERRITIN: Ferritin: 89 ng/mL (ref 22–322)

## 2021-02-07 LAB — APTT: aPTT: 31.4 seconds (ref 22.0–38.0)

## 2021-02-07 LAB — BASIC METABOLIC PANEL
BUN: 22 mg/dL (ref 7–22)
CO2: 27 meq/L (ref 23–33)
Calcium: 9.4 mg/dL (ref 8.5–10.5)
Chloride: 102 meq/L (ref 98–111)
Creatinine: 0.8 mg/dL (ref 0.4–1.2)
Glucose: 61 mg/dL — ABNORMAL LOW (ref 70–108)
Potassium: 4.5 meq/L (ref 3.5–5.2)
Sodium: 139 meq/L (ref 135–145)

## 2021-02-07 LAB — IRON: Iron: 62 ug/dL — ABNORMAL LOW (ref 65–195)

## 2021-02-07 LAB — ANION GAP: Anion Gap: 10 meq/L (ref 8.0–16.0)

## 2021-02-07 LAB — GLOMERULAR FILTRATION RATE, ESTIMATED: Est, Glom Filt Rate: >60 mL/min/{1.73_m2} (ref >60)

## 2021-02-07 LAB — SCAN OF BLOOD SMEAR

## 2021-02-07 LAB — VITAMIN B12 & FOLATE
Folate: >20 ng/mL (ref 4.8–24.2)
Vitamin B-12: 420 pg/mL (ref 211–911)

## 2021-02-07 LAB — IRON BINDING CAPACITY: TIBC: 361 ug/dL (ref 171–450)

## 2021-02-07 LAB — FIBRINOGEN: Fibrinogen: 288 mg/100ml (ref 155–475)

## 2021-02-07 MED ORDER — SODIUM CHLORIDE (PF) 0.9 % IJ SOLN
INTRAMUSCULAR | Status: DC
Start: 2021-02-07 — End: 2021-02-08
  Filled 2021-02-07: qty 20

## 2021-02-07 MED ORDER — IOHEXOL 300 MG/ML IJ SOLN
20.0000 mL | Freq: Once | INTRAMUSCULAR | Status: AC | PRN
Start: 2021-02-07 — End: 2021-02-07
  Administered 2021-02-07: 20 mL via INTRAVENOUS
  Filled 2021-02-07: qty 20

## 2021-02-07 MED ORDER — LIDOCAINE HCL 1 % IJ SOLN
INTRAMUSCULAR | Status: DC
Start: 2021-02-07 — End: 2021-02-07
  Filled 2021-02-07: qty 20

## 2021-02-07 MED ORDER — GADOBUTROL 1 MMOL/ML IV SOSY (WRAP)
INTRAVENOUS | Status: AC
Start: 2021-02-07 — End: 2021-02-07
  Administered 2021-02-07: 0.1 mL via INTRA_ARTICULAR
  Filled 2021-02-07: qty 7.5

## 2021-02-07 NOTE — Discharge Instructions (Signed)
Make sure to refrain from any alcohol use.  Call hematology within the next couple days and follow-up with them for further evaluation.  Return here if symptoms worsen.

## 2021-02-07 NOTE — ED Provider Notes (Signed)
[facility]      EMERGENCY MEDICINE     Pt Name: Gabriel Powers  MRN: [id number]  Birthdate 1955-06-28  Date of evaluation: 02/07/2021  Provider: Myrtha Mantis, DO  Supervising Physician: Aliene Beams, DO    CHIEF COMPLAINT       Chief Complaint   Patient presents with    Abnormal Lab     HISTORY OF PRESENT ILLNESS   Jaycee Pelzer is a pleasant 66 y.o. male with a h/o HCV, HTN, alcohol abuse, IV drug abuse and possible TB who presents to the emergency department from [facility] for evaluation of a platelet level of 51, taken 2 days ago.  His platelet level on 01/08/2019 was 67.  Patient reports he has been feeling fatigued for several months.  He also complains of dry cough, rhinorrhea for the past few months.  He states his been easy to bruise for the past few years.  He reports some mild shortness of breath at rest that he thinks is secondary to weight gain from hospital admission.  Patient denies chest pain, hemoptysis, history of epistaxis, history of profuse bleeding, fever, abdominal pain, nausea, vomiting.  Patient reports he started taking isoniazid 2 weeks ago.    PASTMEDICAL HISTORY     Past Medical History:   Diagnosis Date    Hepatitis C     Hypertension        There is no problem list on file for this patient.    SURGICAL HISTORY     History reviewed. No pertinent surgical history.    CURRENT MEDICATIONS       Previous Medications    No medications on file       ALLERGIES     is allergic to shellfish-derived products.    FAMILY HISTORY     has no family status information on file.        SOCIAL HISTORY          PHYSICAL EXAM       ED Triage Vitals [02/07/21 0941]   BP Temp Temp Source Heart Rate Resp SpO2 Height Weight   133/71 97.7 ??F (36.5 ??C) Oral 80 18 97 % 6\' 3"  (1.905 m) (!) 330 lb (149.7 kg)       Physical Exam  Vitals and nursing note reviewed.   Constitutional:       General: He is not in acute distress.     Appearance: He is obese. He is not ill-appearing or  toxic-appearing.   HENT:      Head: Normocephalic and atraumatic.      Right Ear: External ear normal.      Left Ear: External ear normal.      Nose: Nose normal. No congestion.      Mouth/Throat:      Mouth: Mucous membranes are moist.      Pharynx: Oropharynx is clear.   Eyes:      Conjunctiva/sclera: Conjunctivae normal.   Cardiovascular:      Rate and Rhythm: Normal rate and regular rhythm.      Pulses: Normal pulses.      Heart sounds: Normal heart sounds.   Pulmonary:      Effort: Pulmonary effort is normal. No respiratory distress.      Breath sounds: Normal breath sounds. No wheezing.   Abdominal:      General: There is no distension.      Palpations: Abdomen is soft.      Tenderness: There is no  abdominal tenderness. There is no guarding.   Musculoskeletal:         General: Normal range of motion.      Cervical back: Normal range of motion and neck supple. No tenderness.   Lymphadenopathy:      Cervical: No cervical adenopathy.   Skin:     General: Skin is warm and dry.      Capillary Refill: Capillary refill takes less than 2 seconds.   Neurological:      General: No focal deficit present.      Mental Status: He is alert and oriented to person, place, and time.   Psychiatric:         Mood and Affect: Mood normal.       FORMAL DIAGNOSTIC RESULTS     RADIOLOGY: Interpretation per the Radiologist below, if available at the time of this note (none if blank):    No orders to display       LABS: (none if blank)  Labs Reviewed   CBC WITH AUTO DIFFERENTIAL - Abnormal; Notable for the following components:       Result Value    WBC 2.6 (*)     RBC 4.61 (*)     Hemoglobin 13.1 (*)     MCHC 31.0 (*)     RDW-SD 46.3 (*)     Platelets 58 (*)     All other components within normal limits   BASIC METABOLIC PANEL - Abnormal; Notable for the following components:    Glucose 61 (*)     All other components within normal limits   HEPATIC FUNCTION PANEL - Abnormal; Notable for the following components:    AST 52 (*)     ALT  75 (*)     All other components within normal limits   COVID-19 & INFLUENZA COMBO   PROTIME-INR   APTT   FIBRINOGEN   ANION GAP   GLOMERULAR FILTRATION RATE, ESTIMATED   SCAN OF BLOOD SMEAR   VITAMIN B12 & FOLATE   IRON   IRON BINDING CAPACITY   IRON SATURATION   FERRITIN       (Any cultures that may have been sent were not resulted at the time of this patient visit)    MEDICAL DECISION MAKING / ED COURSE:     1) Number and Complexity of Problems            Problem List This Visit:         Chief Complaint   Patient presents with    Abnormal Lab            Differential Diagnosis includes (but not limited to):  Thrombocytopenia, history of alcohol abuse, ITP, TTP, iron deficiency anemia        Diagnoses Considered but I have low suspicion of:   DIC             Pertinent Comorbid Conditions:    Alcohol use, hepatitis C, IV drug abuse    2)  Data Reviewed (none if left blank)          My Independent interpretations:     EKG:      N/A    Imaging: N/A    Labs:      Platelet count 58, hemoglobin 13.1.                 Decision Rules/Clinical Scores utilized: N/A            External Documentation Reviewed:  Previous patient encounter documents & history available on EMR was reviewed first time seen here             See Formal Diagnostic Results above for the lab and radiology tests and orders.    3)  Treatment and Disposition         ED Reassessment: Patient sitting in bed breathing without any difficulty         Case discussed with consulting clinician: N/A         Shared Decision-Making was performed and disposition discussed with the        Patient/Family and questions answered yes         Social determinants of health impacting treatment or disposition: History of IV drug abuse and alcohol abuse         Code Status: Full      Summary of Patient Presentation:      MDM  Patient is a 66 year old male with a history of alcohol abuse and IV drug abuse who presents from [facility] for evaluation of a low platelet count.   Per reviewing his records his platelet count around 67 a month ago, 2 days ago was 51, today is 9658.  I think this is most likely secondary to his history of alcohol abuse.  He does not warrant a platelet transfusion at this time.  This appears to be a chronic issue, I think he could benefit from hematology follow-up.  This could also be iron deficiency anemia.  Patient's vitals are stable.  ED Course as of 02/07/21 1238   Fri Feb 07, 2021   1158 Platelet Count(!): 3058 [AC]      ED Course User Index  [AC] Myrtha MantisAlexander Suhayla Chisom, DO     Vitals Reviewed:    Vitals:    02/07/21 0941 02/07/21 1149   BP: 133/71 132/62   Pulse: 80 75   Resp: 18 18   Temp: 97.7 ??F (36.5 ??C)    TempSrc: Oral    SpO2: 97% 94%   Weight: (!) 330 lb (149.7 kg)    Height: 6\' 3"  (1.905 m)        The patient was seen and examined. Appropriate diagnostic testing was performed and results reviewed with the patient.      The results of pertinent diagnostic studies and exam findings were discussed. The patient???s provisional diagnosis and plan of care were discussed with the patient and present family who expressed understanding. Any medications were reviewed and indications and risks of medications were discussed with the patient /family present. Strict verbal and written return precautions, instructions and appropriate follow-up provided to  the patient.     ED Medications administered this visit:  (None if blank)  Medications - No data to display      PROCEDURES: (None if blank)  Procedures:     CRITICAL CARE: (None if blank)      DISCHARGE PRESCRIPTIONS: (None if blank)  New Prescriptions    No medications on file       FINAL IMPRESSION      1. Thrombocytopenia [facility](HCC)          DISPOSITION/PLAN   DISPOSITION Decision To Discharge 02/07/2021 12:25:35 PM      OUTPATIENT FOLLOW UP THE PATIENT:  [facility]  [address]  [place] [id number]  [phone]  In 2 days  For evaluation    [facility]  [address]  [place]  [id number]  [phone]  If symptoms worsen    Myrtha MantisAlexander Donzella Carrol, DO    This transcription was electronically signed. Parts of this transcriptions may have been dictated by use of voice recognition software and electronically transcribed, and parts may have been transcribed with the assistance of an ED scribe. The transcription may contain errors not detected in proofreading.  Please refer to my supervising physician's documentation if my documentation differs.    Electronically Signed: Myrtha MantisAlexander Nechuma Boven, DO, 02/07/21, 12:38 PM      Myrtha MantisAlexander Iridian Reader, DO  Resident  02/07/21 [phone]

## 2021-02-07 NOTE — ED Notes (Signed)
Report called to [facility] at this time. This nurse spoke to [place] at [facility]. [facility] will be sending a driver.      Janice Norrie Depweg, RN  02/07/21 1242

## 2021-02-07 NOTE — ED Notes (Signed)
Pt resting on cot at this time. No needs voiced.      Janice Norrie Depweg, RN  02/07/21 1150

## 2021-02-07 NOTE — ED Provider Notes (Signed)
I performed a history and physical examination of the patient and discussed management with the resident. I reviewed the resident???s note and agree with the documented findings and plan of care. Any areas of disagreement are noted on the chart. I was personally present for the key portions of any procedures. I have documented in the chart those procedures where I was not present during the key portions. I have reviewed the emergency nurses triage note. I agree with the chief complaint, past medical history, past surgical history, allergies, medications, social and family history as documented unless otherwise noted below. Documentation of the HPI, Physical Exam and Medical Decision Making performed by medical students or scribes is based on my personal performance of the HPI, PE and MDM. For Phys Assistant/ Nurse Practitioner cases/documentation I have personally evaluated this patient and have completed at least one if not all key elements of the E/M (history, physical exam, and MDM). My findings are as noted below.    In other words, I personally saw and examined the patient I have reviewed and agreed with the resident findings including all diagnostic interpretations and treatment plans as written.  I was present for the key portion of any procedures performed and the inclusive time noted in any critical care statement.    Patient presents today for history of alcohol abuse drug IV abuse.  He presents today from [facility] for evaluations of platelets of 51.  Patient had low platelets prior to this at 68.  Here today he has no evidence of bleeding.  No evidence of petechia, no bruising.  Patient has no altered mental status.  He states that he has had easy bruising in the past.  Patient is from out of town we have no other labs except for those obtained from [facility].  Here today patient is not complaining of any physical complaints.  He is resting comfortably on the cot no apparent distress.  No other  physical complaints at this time.      No orders to display     Labs Reviewed   CBC WITH AUTO DIFFERENTIAL - Abnormal; Notable for the following components:       Result Value    WBC 2.6 (*)     RBC 4.61 (*)     Hemoglobin 13.1 (*)     MCHC 31.0 (*)     RDW-SD 46.3 (*)     Platelets 58 (*)     Segs Absolute 1.5 (*)     Lymphocytes Absolute 0.7 (*)     Monocytes Absolute 0.3 (*)     All other components within normal limits   BASIC METABOLIC PANEL - Abnormal; Notable for the following components:    Glucose 61 (*)     All other components within normal limits   HEPATIC FUNCTION PANEL - Abnormal; Notable for the following components:    AST 52 (*)     ALT 75 (*)     All other components within normal limits   IRON - Abnormal; Notable for the following components:    Iron 62 (*)     All other components within normal limits   IRON SATURATION - Abnormal; Notable for the following components:    Iron Saturation 17 (*)     All other components within normal limits   COVID-19 & INFLUENZA COMBO   PROTIME-INR   APTT   FIBRINOGEN   ANION GAP   GLOMERULAR FILTRATION RATE, ESTIMATED   SCAN OF BLOOD SMEAR  VITAMIN B12 & FOLATE   IRON BINDING CAPACITY   FERRITIN         Final diagnoses:   Thrombocytopenia (HCC)   .  I have seen this patient with Dr. Adriana Simas the resident and agree with his assessment and plan.     Patrina Levering, DO  02/07/21 1658

## 2021-02-07 NOTE — ED Notes (Signed)
Pt to ER from [facility] due to low platelet count. [facility] reports count as been decreasing over time.      Janice Norrie Depweg, RN  02/07/21 1149

## 2021-02-08 LAB — MISCELLANEOUS MAYO TEST

## 2021-02-10 ENCOUNTER — Other Ambulatory Visit (INDEPENDENT_AMBULATORY_CARE_PROVIDER_SITE_OTHER): Payer: Self-pay | Admitting: Hospitalist

## 2021-02-12 LAB — CARDIO IQ(R) ADVANCED LIPID PANEL
Apolipoprotein B: 91 mg/dL — ABNORMAL HIGH
Cholesterol Total (Quest): 188 mg/dL (ref <200)
Cholesterol/HDL Ratio: 3.4 calc (ref <5.0)
HDL Cholesterol: 55 mg/dL (ref >=40)
HDL Large: 4413 nmol/L — ABNORMAL LOW (ref >6729)
LDL Chol, Calculated: 116 — ABNORMAL HIGH (ref <100)
LDL Medium: 197 nmol/L (ref <215)
LDL Particle Number: 923 nmol/L (ref <1138)
LDL Peak Size: 221.7 Angstrom — ABNORMAL LOW (ref >222.9)
LDL Small: 132 nmol/L (ref <142)
Lipoprotein (a): 108 nmol/L — ABNORMAL HIGH (ref <75)
Non-HDL Cholesterol: 133 — ABNORMAL HIGH (ref <130)
Triglycerides (Quest): 75 mg/dL (ref <150)

## 2021-03-03 ENCOUNTER — Encounter: Payer: Self-pay | Admitting: Hospitalist

## 2021-03-03 ENCOUNTER — Ambulatory Visit (INDEPENDENT_AMBULATORY_CARE_PROVIDER_SITE_OTHER): Payer: No Typology Code available for payment source | Admitting: Hospitalist

## 2021-03-03 VITALS — BP 135/91 | HR 50 | Temp 97.0°F | Ht 68.0 in | Wt 184.6 lb

## 2021-03-03 DIAGNOSIS — M25511 Pain in right shoulder: Secondary | ICD-10-CM

## 2021-03-03 DIAGNOSIS — Z0000 Encounter for general adult medical examination without abnormal findings: Secondary | ICD-10-CM

## 2021-03-03 DIAGNOSIS — M25519 Pain in unspecified shoulder: Secondary | ICD-10-CM | POA: Insufficient documentation

## 2021-03-03 DIAGNOSIS — I10 Essential (primary) hypertension: Secondary | ICD-10-CM

## 2021-03-03 DIAGNOSIS — E7800 Pure hypercholesterolemia, unspecified: Secondary | ICD-10-CM

## 2021-03-03 DIAGNOSIS — F17290 Nicotine dependence, other tobacco product, uncomplicated: Secondary | ICD-10-CM

## 2021-03-03 DIAGNOSIS — Z5181 Encounter for therapeutic drug level monitoring: Secondary | ICD-10-CM

## 2021-03-03 DIAGNOSIS — E7849 Other hyperlipidemia: Secondary | ICD-10-CM

## 2021-03-03 HISTORY — DX: Pain in unspecified shoulder: M25.519

## 2021-03-03 MED ORDER — LOSARTAN POTASSIUM 25 MG PO TABS
25.0000 mg | ORAL_TABLET | Freq: Every day | ORAL | 3 refills | Status: DC
Start: 2021-03-03 — End: 2022-03-03

## 2021-03-03 NOTE — Progress Notes (Addendum)
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 U.S. Food and Drug Administration.    Test Performed by:  [facility]  [address] [place], [place], [place] [address]  Lab Director: Paul Dykes M.D. Ph.D.; CLIA# [id number]          Assessment/Plan: It was a pleasure to see Joshua Meyers today. Due to power outage, it was really cold and we did exam with natural lights - we appreciated his flexibility and good humor during the visit.     1. Annual Exam  Active issues are as summarized below. In addition to below, these are preventive health issues for Joshua Meyers.    He has healthy habits and remains active. His InBody shows low visceral fat and good muscle - he is encouraged to continue to stay active.    Due to age - offer Prevnar 20 during next lab visit in a couple of weeks - I do recommend it as it decreases risk of invasive pneumonia.     Due to age plus intermittent cigar smoking, I also recommend screening for AAA - it is not suspected but rather a one time screen - we will remind him of this in spring after his shoulder surgery.    Return in a couple of weeks for baseline annual EKG.    I  do also recommend a complete colonoscopy over just stool based test for colon cancer screening - we will revisit this after his shoulder surgery.     2. Preventive health  For his age and skin type, I  recommend an annual complete skin check by a dermatologist. I recommend dilated eye exam with with glaucoma checks annually and dental exams/cleanings at least twice a year.    3. Pure hypercholesterolemia  We reviewed his lipid profile together. He also looked at his MESA risk score. He also has elevated Lp(a), Apo B and has family history. He has hesitation about statins and also his dad developed inclusion body myositis that was thought to be due to statin. His lifestyle factors, such as diet and exercise are already good, so it is unlikely to improve further.     I d/w him that I will refer him to Dr. Wallace Going with [facility] to discuss other options such as PSK-9 inhibitors. We did collectively decide that the goal BP is normal which is 120/80 - given other risk factors, we want to minimize high blood pressure as an added risk factor for cardiovascular disease.     Garth Schlatter, MD  [address]  [address] [place] [address] [place] [address]  Phone: [phone]  Fax: [phone]    He had URI on the day of the lab test, so elevated CRP can be explained by that. Prior results were normal.    4. Right shoulder surgery  Joshua Meyers has excellent functional capacity, he does not have any concerning cardiopulmonary symptoms. He does not have any history of complications related to anesthesia. Due to power outage, we could not do EKG, he will return to the office for this in two weeks.     At this time, I recommend proceeding with planned surgery without additional testing as he as acceptable risk factors.    5. Primary hypertension  Goal BP is 120/80 or less.  Start Losartan low dose 25mg  daily.  Return for repeat BMP to monitor electrolytes in about 2 weeks. Stop any extra potassium supplementation while on this.       Orders Placed This Encounter   Procedures    Pneumococcal Conjugate 20 - Valent    Basic Metabolic Panel    Referral to Cardiology - EXTERNAL       Risks & benefits of the new medication(s) were explained to  the patient, who appeared to understand and agrees to the treatment plan.    Addendum:    Mr. Dever returned on 03/21/2021 for pre-op EKG. Since his last visit, he has also started Losartan 25mg  daily for his blood pressure.    EKG and this note will be faxed to his surgeon Dr. Sena Hitch.  EKG shows sinus brady with normal morphology at 43bpm.   He has a bradycardic baseline, likely on account of being fit and athletic. He has had cardiac monitor in 2021 - and HR ranged from 35-109 with rare ectopic beats.     Based on current ACC/AHA guidelines, proceed with planned surgery without need for additional testing. He has excellent functional capacity.     Allen Norris, MD, [facility] [address]  [address]  [place], [place] [address]  P) [phone]  F) [phone]  [email or web address]

## 2021-03-03 NOTE — Progress Notes (Signed)
Fitness Evaluation:   Body Fat as percentage: In men, over 25% is obese, 20-25% is higher than normal, 16-20% is healthy / normal, <16% or under is considered lean / ideal.  2023= 20.2%  2022= 20.2%    Visceral Fat: Abdominal "belly" fat.Visceral fat is a type of body fat that exists in the abdomen and surrounds the internal organs. Everyone has some, especially those who are sedentary, chronically stressed, or maintain unhealthy diets. A different type of fat -- subcutaneous fat -- which builds up under the skin, has less of a negative impact on health and is easier to lose than visceral fat. A high level of visceral fat can increase your risk for serious health problems including cardiovascular disease, types 2 diabetes, and increased blood pressure.    2023 = 7 (normal <10)  2022 = 7    InBody:  InBody results reviewed  Patient endorses working out typically 3x week (sometimes 4, based on travel schedule)  Trying to get back into outdoor running after battling with plantar fascitis   Goals are to increase muscle mass, cardiovascular abilities, and immune system  Workouts focus primarily on strength training with yoga; few weights    Audiology test:   Audiology Exam demonstrated normal hearing results.    Vision Test:   Patient sees Optometrist/Ophthalmologist yearly.    Pulmonary Function Test:  Test not done do to current COVID precautions.    Testing:  Labs were drawn 12JAN.    Immunizations:  Patient has not received pneumonia immunization.    Advanced Directive:  Patient has Advanced Directive in place, asked to send [place] a copy via email/MyChart/Hard copy.    EKG:  EKG unable to be performed during this annual exam due to limitations caused by no power/electricity status in clinic.    Health Maintenance:  Colonoscopy: Overdue

## 2021-03-04 ENCOUNTER — Ambulatory Visit: Payer: MEDICARE

## 2021-03-04 ENCOUNTER — Ambulatory Visit: Payer: MEDICARE | Attending: Physician Assistant

## 2021-03-04 ENCOUNTER — Telehealth (INDEPENDENT_AMBULATORY_CARE_PROVIDER_SITE_OTHER): Payer: Self-pay | Admitting: Cardiovascular Disease

## 2021-03-04 ENCOUNTER — Encounter: Payer: Self-pay | Admitting: Hospitalist

## 2021-03-04 NOTE — Telephone Encounter (Signed)
Pt was called LVM to call us back to schedule an appointment to see Dr.Kopin. (pt physical address is [place] pt can be schedule in [place] or [place] office  whichever  the pt prefers.   Ref by Henrene Pastor

## 2021-03-04 NOTE — Progress Notes (Signed)
[facility]  [address], [address]  [place] [address]  Dept: [phone]  Dept Fax: [phone]  Loc: [phone]      The patient did not show to his scheduled office visit today. Our office staff will attempt to contact the patient and reschedule.     Electronically signed by   Gaspar Skeeters, PA-C on 03/04/2021 at 2:34 PM

## 2021-03-10 ENCOUNTER — Other Ambulatory Visit: Payer: Self-pay

## 2021-03-10 MED ORDER — TAMSULOSIN HCL 0.4 MG PO CAPS
0.4000 mg | ORAL_CAPSULE | Freq: Every day | ORAL | 3 refills | Status: DC
Start: 2021-03-10 — End: 2022-04-23

## 2021-03-19 ENCOUNTER — Encounter: Payer: Self-pay | Admitting: Hospitalist

## 2021-03-21 ENCOUNTER — Ambulatory Visit (INDEPENDENT_AMBULATORY_CARE_PROVIDER_SITE_OTHER): Payer: No Typology Code available for payment source

## 2021-03-21 DIAGNOSIS — Z0000 Encounter for general adult medical examination without abnormal findings: Secondary | ICD-10-CM

## 2021-03-21 DIAGNOSIS — Z23 Encounter for immunization: Secondary | ICD-10-CM

## 2021-03-21 DIAGNOSIS — Z5181 Encounter for therapeutic drug level monitoring: Secondary | ICD-10-CM

## 2021-03-21 LAB — BASIC METABOLIC PANEL
Anion Gap: 9 (ref 5.0–15.0)
BUN: 14 mg/dL (ref 9.0–28.0)
CO2: 25 mEq/L (ref 17–29)
Calcium: 9.5 mg/dL (ref 8.5–10.5)
Chloride: 107 mEq/L (ref 99–111)
Creatinine: 1 mg/dL (ref 0.5–1.5)
Glucose: 85 mg/dL (ref 70–100)
Potassium: 4.6 mEq/L (ref 3.5–5.3)
Sodium: 141 mEq/L (ref 135–145)

## 2021-03-21 LAB — HEMOLYSIS INDEX: Hemolysis Index: 4 Index (ref 0–24)

## 2021-03-21 LAB — GFR: EGFR: >60

## 2021-03-21 NOTE — Progress Notes (Signed)
Labs were drawn from [facility] without difficulty. Aseptic technique utilized. No complications noted. Attempts x 1. Dressing applied. Patient tolerated well. Urine not collected. Labs sent to [facility].       PREVNAR 20 given in Left deltoid. VIS given to patient. Patient left in good condition.      Instructed to apply a cold compression to injection site if soreness, redness, itching or swelling occurs.  Instructed to take tylenol or motrin if soreness occurs. Informed to contact the office if redness, swelling or soreness persists after 5 days.

## 2021-04-01 HISTORY — PX: OTHER SURGICAL HISTORY: SHX169

## 2021-04-29 ENCOUNTER — Telehealth: Payer: Self-pay | Admitting: Hospitalist

## 2021-04-29 ENCOUNTER — Encounter: Payer: Self-pay | Admitting: Hospitalist

## 2021-04-29 DIAGNOSIS — Z1211 Encounter for screening for malignant neoplasm of colon: Secondary | ICD-10-CM

## 2021-04-29 NOTE — Telephone Encounter (Signed)
Opened in error. Message sent via portal.

## 2021-10-10 ENCOUNTER — Telehealth: Payer: Self-pay | Admitting: Hospitalist

## 2021-10-10 NOTE — Telephone Encounter (Signed)
Direct Access Endoscopy Questionnaire    INDICATION:  Colon Cancer Screening    IF THE PATIENT ANSWERS YES TO ANY OF THE FOLLOWING RISK FACTORS, PLEASE ROUTE TO ADVANCED PRACTICE PROVIDER OR NURSE FOR REVIEW PRIOR TO SCHEDULING FOR PROCEDURE:    RISK FACTORS:  Colonoscopy w/in last 10 year. Date: 12/14/2017      ACTION TAKEN:  Forwarded to NP or Nurse for review

## 2021-10-10 NOTE — Telephone Encounter (Signed)
RN LVM for patient to call to schedule annual exam and inquired about status of scheduling with [facility] cardiologist.

## 2021-10-14 NOTE — Telephone Encounter (Signed)
From: [email or web address]   Sent: Tuesday, October 14, 2021 9:41 AM  To: [email or web address]  Subject: Re: Annual Exam with Dr. Lanell Matar    ATTENTION: This email originated from outside of [place]. Please proceed with caution if asked to click links or open attachments.  Hi Joshua Meyers,   I appreciate your reaching out to schedule the physical.   I'm afraid left on my own it would never get done.   Lets schedule Wednesday, March 28 at 9am.  I have not seen the cardiologist but will get that scheduled asap.   My blood pressure reading (although I have not been particularly diligent) have been pretty normal.   I'll bring some readings with me when I come to the physical.   Let me know if you have any other questions?  Best, Joshua Meyers

## 2021-10-14 NOTE — Telephone Encounter (Signed)
From: Linard Millers   Sent: Tuesday, October 14, 2021 10:13 AM  To: '[email or web address]' [email or web address]>  Subject: RE: Annual Exam with Dr. Lanell Matar    Thank you for your expedient response, Mr. Prevette!    I have you locked in for Thursday, April 23, 2022 at 9:00 am.    However, would you like to come a week prior to do your fasting labs/InBody so that you do not have to come fasting the day of your appointment? I believe you've done that before; most patients prefer it. If you do decide on this option, then you can come a half hour later to your physical exam as well (starting at 9:30 am instead).    If so, we can schedule you to come in as early as you like for fasting labs. I have several date/time slots available, listed below so let me know if you're interested and I'll confirm you for that as well:    Wednesday, April 15, 2022 at 7:00 am, 7:30 am, 8:00am, 8:30 am  Thursday, April 16, 2022 at 7:00 am, 7:30 am, 8:00am, 8:30 am  Friday, April 17, 2022 at 7:00 am, 7:30 am, 8:00am, 8:30 am    I have no other questions at this time; I'm just hopeful that the power stays on during your next annual exam!    Mikey Kirschner  Registered Nurse  [facility] [address], [address]  [place], [place] [address]  T [phone]  F [phone]  [email or web address]

## 2021-10-14 NOTE — Telephone Encounter (Signed)
From: Linard Millers   Sent: Tuesday, October 14, 2021 9:29 AM  To: '[email or web address]  Subject: Annual Exam with Dr. Cammie Sickle morning, Mr. Marissa, Lowrey may remember me from your annual exam this past February.I am Dr. Jacky Kindle nurse, Lupita Leash.    I was reaching out to schedule you for your annual exam in 2024, as dates are filling up fast into March and I would like to get you on the books!    I have several dates available in March 2024 to schedule your annual exam; please review the dates below and let me know if any are conducive to your schedule:    Wednesday, April 22, 2022 at 9:00 am  Thursday, April 23, 2022 at 9:00 am OR 11:30 am  Friday, April 24, 2022 at 11:30 am    Dr. Lanell Matar was also eager to hear about how your blood pressure readings have been at home, as well as if you were able to get an appointment with the cardiologist, Dr. Wallace Going.     I have also reached out to [facility] group for them to contact you regarding scheduling a colonoscopy consultation so they should be in touch soon!    Thank you and I look forward to hearing from you!      Mikey Kirschner  Registered Nurse  [facility] [address], [address]  [place], [place] [address]  T [phone]  F [phone]  [email or web address]

## 2021-10-25 ENCOUNTER — Encounter (INDEPENDENT_AMBULATORY_CARE_PROVIDER_SITE_OTHER): Payer: Self-pay

## 2021-11-18 ENCOUNTER — Encounter (INDEPENDENT_AMBULATORY_CARE_PROVIDER_SITE_OTHER): Payer: Self-pay

## 2021-11-18 ENCOUNTER — Telehealth (INDEPENDENT_AMBULATORY_CARE_PROVIDER_SITE_OTHER): Payer: Self-pay

## 2021-11-18 NOTE — Telephone Encounter (Signed)
LVM for [place] [facility] patient in regards to scheduling his screening colonoscopy consult with one of our available provider prior to scheduling his procedure due to answering yes to rectal bleeding in his patient questionnaire. Informed patient to give [place] a call back at our call center # [phone] when ready to schedule visit.

## 2021-11-25 ENCOUNTER — Telehealth (INDEPENDENT_AMBULATORY_CARE_PROVIDER_SITE_OTHER): Payer: Self-pay

## 2021-11-25 NOTE — Telephone Encounter (Signed)
Lvm to schedule an OV for +FIT.

## 2022-03-19 ENCOUNTER — Encounter: Payer: Self-pay | Admitting: Hospitalist

## 2022-03-19 ENCOUNTER — Other Ambulatory Visit: Payer: Self-pay | Admitting: Hospitalist

## 2022-03-19 MED ORDER — LOSARTAN POTASSIUM 25 MG PO TABS
25.0000 mg | ORAL_TABLET | Freq: Every day | ORAL | 3 refills | Status: DC
Start: 2022-03-19 — End: 2022-04-23

## 2022-03-19 NOTE — Progress Notes (Signed)
Sending for Losartan refill to local pharmacy. Wife had reached out through her portal.

## 2022-04-08 ENCOUNTER — Encounter: Payer: Self-pay | Admitting: Hospitalist

## 2022-04-08 ENCOUNTER — Telehealth: Payer: Self-pay | Admitting: Hospitalist

## 2022-04-08 NOTE — Telephone Encounter (Signed)
From: Ozella Rocks   Sent: Wednesday, April 08, 2022 11:57 AM  To: '[email or web address]' [email or web address]>  Subject: Upcoming Physical 28MAR 9:00 am    Good morning, Mr. Weise,    I was reaching out to remind you of your annual exam with Dr. Jerene Pitch and myself on Thursday, March 28th at 9:00 am in our office at [address], [id number], [place], [place] [address].     Also, I wanted to touch base with you about scheduling with [facility] gastroenterology group. Please give their office a call at [phone] at your earliest convenience- thank you!    I look forward to seeing you again!    Marisue Brooklyn  She/Her  Registered Nurse    [facility] [id number]  [place], [place] [address]  O [phone]  F [phone]  [email or web address]        [facility]

## 2022-04-22 ENCOUNTER — Other Ambulatory Visit: Payer: Self-pay | Admitting: Hospitalist

## 2022-04-22 DIAGNOSIS — Z8639 Personal history of other endocrine, nutritional and metabolic disease: Secondary | ICD-10-CM

## 2022-04-22 DIAGNOSIS — E538 Deficiency of other specified B group vitamins: Secondary | ICD-10-CM

## 2022-04-22 DIAGNOSIS — I10 Essential (primary) hypertension: Secondary | ICD-10-CM

## 2022-04-22 DIAGNOSIS — E559 Vitamin D deficiency, unspecified: Secondary | ICD-10-CM

## 2022-04-22 DIAGNOSIS — Z131 Encounter for screening for diabetes mellitus: Secondary | ICD-10-CM

## 2022-04-22 DIAGNOSIS — E785 Hyperlipidemia, unspecified: Secondary | ICD-10-CM

## 2022-04-22 DIAGNOSIS — Z0000 Encounter for general adult medical examination without abnormal findings: Secondary | ICD-10-CM

## 2022-04-22 DIAGNOSIS — N401 Enlarged prostate with lower urinary tract symptoms: Secondary | ICD-10-CM

## 2022-04-22 DIAGNOSIS — E7841 Elevated Lipoprotein(a): Secondary | ICD-10-CM

## 2022-04-22 DIAGNOSIS — Z125 Encounter for screening for malignant neoplasm of prostate: Secondary | ICD-10-CM

## 2022-04-23 ENCOUNTER — Encounter: Payer: Self-pay | Admitting: Hospitalist

## 2022-04-23 ENCOUNTER — Ambulatory Visit (INDEPENDENT_AMBULATORY_CARE_PROVIDER_SITE_OTHER): Payer: No Typology Code available for payment source | Admitting: Hospitalist

## 2022-04-23 VITALS — BP 125/79 | HR 48 | Temp 97.6°F | Ht 70.2 in | Wt 181.3 lb

## 2022-04-23 DIAGNOSIS — I10 Essential (primary) hypertension: Secondary | ICD-10-CM

## 2022-04-23 DIAGNOSIS — Z131 Encounter for screening for diabetes mellitus: Secondary | ICD-10-CM

## 2022-04-23 DIAGNOSIS — Z8639 Personal history of other endocrine, nutritional and metabolic disease: Secondary | ICD-10-CM

## 2022-04-23 DIAGNOSIS — E559 Vitamin D deficiency, unspecified: Secondary | ICD-10-CM

## 2022-04-23 DIAGNOSIS — R3912 Poor urinary stream: Secondary | ICD-10-CM

## 2022-04-23 DIAGNOSIS — N401 Enlarged prostate with lower urinary tract symptoms: Secondary | ICD-10-CM

## 2022-04-23 DIAGNOSIS — Z125 Encounter for screening for malignant neoplasm of prostate: Secondary | ICD-10-CM

## 2022-04-23 DIAGNOSIS — S43439A Superior glenoid labrum lesion of unspecified shoulder, initial encounter: Secondary | ICD-10-CM

## 2022-04-23 DIAGNOSIS — E7841 Elevated Lipoprotein(a): Secondary | ICD-10-CM

## 2022-04-23 DIAGNOSIS — Z136 Encounter for screening for cardiovascular disorders: Secondary | ICD-10-CM

## 2022-04-23 DIAGNOSIS — E538 Deficiency of other specified B group vitamins: Secondary | ICD-10-CM

## 2022-04-23 DIAGNOSIS — E785 Hyperlipidemia, unspecified: Secondary | ICD-10-CM

## 2022-04-23 DIAGNOSIS — Z0000 Encounter for general adult medical examination without abnormal findings: Secondary | ICD-10-CM

## 2022-04-23 HISTORY — DX: Superior glenoid labrum lesion of unspecified shoulder, initial encounter: S43.439A

## 2022-04-23 LAB — CBC AND DIFFERENTIAL
Absolute NRBC: 0 10*3/uL (ref 0.00–0.00)
Basophils Absolute Automated: 0.06 10*3/uL (ref 0.00–0.08)
Basophils Automated: 1 %
Eosinophils Absolute Automated: 0.22 10*3/uL (ref 0.00–0.44)
Eosinophils Automated: 3.6 %
Hematocrit: 44.8 % (ref 37.6–49.6)
Hgb: 15.5 g/dL (ref 12.5–17.1)
Immature Granulocytes Absolute: 0.01 10*3/uL (ref 0.00–0.07)
Immature Granulocytes: 0.2 %
Instrument Absolute Neutrophil Count: 3.79 10*3/uL (ref 1.10–6.33)
Lymphocytes Absolute Automated: 1.59 10*3/uL (ref 0.42–3.22)
Lymphocytes Automated: 26.1 %
MCH: 30.2 pg (ref 25.1–33.5)
MCHC: 34.6 g/dL (ref 31.5–35.8)
MCV: 87.3 fL (ref 78.0–96.0)
MPV: 11.1 fL (ref 8.9–12.5)
Monocytes Absolute Automated: 0.43 10*3/uL (ref 0.21–0.85)
Monocytes: 7 %
Neutrophils Absolute: 3.79 10*3/uL (ref 1.10–6.33)
Neutrophils: 62.1 %
Nucleated RBC: 0 /100 WBC (ref 0.0–0.0)
Platelets: 232 10*3/uL (ref 142–346)
RBC: 5.13 10*6/uL (ref 4.20–5.90)
RDW: 12 % (ref 11–15)
WBC: 6.1 10*3/uL (ref 3.10–9.50)

## 2022-04-23 LAB — URINALYSIS REFLEX TO MICROSCOPIC EXAM - REFLEX TO CULTURE
Bilirubin, UA: NEGATIVE
Blood, UA: NEGATIVE
Glucose, UA: NEGATIVE
Ketones UA: NEGATIVE
Leukocyte Esterase, UA: NEGATIVE
Nitrite, UA: NEGATIVE
Specific Gravity UA: 1.024 (ref 1.001–1.035)
Urine pH: 6 (ref 5.0–8.0)
Urobilinogen, UA: NORMAL mg/dL

## 2022-04-23 LAB — THYROID STIMULATING HORMONE (TSH), REFLEX ON ABNORMAL TO FREE T4, SERUM: TSH, Abn Reflex to Free T4, Serum: 1.65 u[IU]/mL (ref 0.35–4.94)

## 2022-04-23 LAB — ECG 12-LEAD, NO CHARGE
Atrial Rate: 49 {beats}/min
P Axis: 67 degrees
P-R Interval: 202 ms
Q-T Interval: 434 ms
QRS Duration: 78 ms
QTC Calculation (Bezet): 392 ms
R Axis: 73 degrees
T Axis: 74 degrees
Ventricular Rate: 49 {beats}/min

## 2022-04-23 LAB — URIC ACID: Uric acid: 5.7 mg/dL (ref 3.6–9.7)

## 2022-04-23 LAB — HEPATIC FUNCTION PANEL
ALT: 24 U/L (ref 0–55)
AST (SGOT): 23 U/L (ref 5–41)
Albumin/Globulin Ratio: 1.4 (ref 0.9–2.2)
Albumin: 4.2 g/dL (ref 3.5–5.0)
Alkaline Phosphatase: 53 U/L (ref 37–117)
Bilirubin Direct: 0.3 mg/dL (ref 0.0–0.5)
Bilirubin Indirect: 0.5 mg/dL (ref 0.2–1.0)
Bilirubin, Total: 0.8 mg/dL (ref 0.2–1.2)
Globulin: 2.9 g/dL (ref 2.0–3.6)
Protein, Total: 7.1 g/dL (ref 6.0–8.3)

## 2022-04-23 LAB — LIPID PANEL
Cholesterol / HDL Ratio: 4.1 Index
Cholesterol: 210 mg/dL — ABNORMAL HIGH (ref 0–199)
HDL: 51 mg/dL (ref 40–9999)
LDL Calculated: 143 mg/dL — ABNORMAL HIGH (ref 0–99)
Triglycerides: 80 mg/dL (ref 34–149)
VLDL Calculated: 16 mg/dL (ref 10–40)

## 2022-04-23 LAB — BASIC METABOLIC PANEL
Anion Gap: 6 (ref 5.0–15.0)
BUN: 17 mg/dL (ref 9.0–28.0)
CO2: 26 mEq/L (ref 17–29)
Calcium: 9.5 mg/dL (ref 8.5–10.5)
Chloride: 107 mEq/L (ref 99–111)
Creatinine: 1.1 mg/dL (ref 0.5–1.5)
Glucose: 96 mg/dL (ref 70–100)
Potassium: 4.7 mEq/L (ref 3.5–5.3)
Sodium: 139 mEq/L (ref 135–145)
eGFR: >60 mL/min/{1.73_m2} (ref >=60)

## 2022-04-23 LAB — HEMOLYSIS INDEX: Hemolysis Index: 5 Index (ref 0–24)

## 2022-04-23 LAB — PROSTATE SPECIFIC ANTIGEN SCREEN: Prostate Specific Antigen Screen: 0.598 ng/mL (ref 0.000–4.000)

## 2022-04-23 LAB — MICROALBUMIN, RANDOM URINE
Urine Creatinine, Random: 181 mg/dL
Urine Microalbumin, Random: 7 ug/ml (ref 0.0–30.0)
Urine Microalbumin/Creatinine Ratio: 4 ug/mg (ref 0–30)

## 2022-04-23 LAB — HEMOGLOBIN A1C
Average Estimated Glucose: 99.7 mg/dL
Hemoglobin A1C: 5.1 % (ref 4.6–5.6)

## 2022-04-23 LAB — VITAMIN D,25 OH,TOTAL: Vitamin D, 25 OH, Total: 49 ng/mL (ref 30–100)

## 2022-04-23 LAB — VITAMIN B12: Vitamin B-12: 363 pg/mL (ref 211–911)

## 2022-04-23 LAB — C-REACTIVE PROTEIN HIGH SENSITIVE: C-Reactive Protein, High Sensitive: 0.04 mg/dL (ref 0.00–1.00)

## 2022-04-23 MED ORDER — TAMSULOSIN HCL 0.4 MG PO CAPS
0.4000 mg | ORAL_CAPSULE | Freq: Every day | ORAL | 3 refills | Status: AC
Start: 2022-04-23 — End: ?

## 2022-04-23 MED ORDER — LOSARTAN POTASSIUM 25 MG PO TABS
25.0000 mg | ORAL_TABLET | Freq: Every day | ORAL | 3 refills | Status: AC
Start: 2022-04-23 — End: 2023-04-23

## 2022-04-23 NOTE — Progress Notes (Signed)
Chief Complaint   Patient presents with    Annual Exam     History of Present Illness    Joshua Meyers is a 67 y.o. male here for evaluation and treatment of the following concerns: Annual Exam    Joshua Meyers has had a good year overall. He is now about one year out from his R shoulder surgery, he has his range of motion back, he has been able to lift, exercise, and generally feels well. His biggest concern is that of arthritis/pain and stiffness of the hand at the base of his thumbs.     He has had not concerning cardiopulmonary symptoms, his baseline heart rate runs low. He used to be an avid runner but does not run anymore. He reports good endurance, no chest pain or tightness, no palpitations, no syncope, no near syncope, no wheezing or shortness of breath, no PND or orthopnea.     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 He is not traveling as much and he prefers it. He is married to Dr. Leward Quan, and lives in [place]. They are near their son and their daughter's family - have a three year old and 32 month old grandchildren, girl and a boy - he and his wife really enjoy time with the young grand-children.     Parents are both alive and now on home hospice, they are in their 49s.    DIET - Tends to do intermittent fasting as he is not hungry in the morning, lunch is light and dinner is either outside (four days a week) or at home. Mostly follows Dr. Antionette Char low lectin diet with more plants, nuts, berries, and avoids red meat. Avoids processed foods. Limits alcohol to four drinks a week. One cigar a month for recreation.    EXERCISE: He remains active, and activities include walking dog, whole body, yoga and core workout 3-4 times a week. He also does strength training. Does about 4-5 days of the  week, plus a three mile walk daily.    SLEEP: 8 hours daily, no snoring unless dealing with allergies, feels refreshed when he wakes up.     Patient Active Problem List    Diagnosis Date Noted    Hypertension 03/03/2021     BP Readings from Last 3 Encounters:   03/03/21 (!) 135/91   02/07/21 139/80   02/27/20 136/82     Started Losartan after 02/2021 visit.    Doing well on Losartan 25mg  daily  BP 125/79         Vitamin B12 deficiency 02/14/2019    Benign prostatic hyperplasia with weak urinary stream 02/14/2019     Symptoms improved on Tamsulosin      Primary osteoarthritis of both first carpometacarpal joints 02/14/2019    Pure hypercholesterolemia 09/14/2017     ASCVD risk is about 13% based on 2021 labs. Lower BP by lowering salt first.   Statin therapy will decrease that risk. He also has family history of CAD.   CAC score ordered.     Feb 2023 - CAC sore 141 - results in Epic. Based on MESA risk score 10 year ASCVD risk is 11.4%.   He has understandable hesitation about statins, his father developed inclusion body myositis attributed to statin.  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    Family history of heart disease 08/11/2017    Vitamin D deficiency 08/11/2017     Past Medical History:   Diagnosis Date    Ankle fracture 1960s    Right ankle    Ankle fracture, left 1977    Arrhythmia 2001    dt Graves disease which resolved; Afib per [facility] history    Fracture     bilat ankles childhood    Graves disease 2001    Treated 2001    Hamstring injury 2001    Hernia, inguinal 2017    Left side    Hyperthyroidism 2001    Graves disease for 18 months; resolved on its own    Inguinal hernia 2019    left side    Pinched nerve     L3    Plantar fasciitis of right foot 2019    Sciatica 2016    Shoulder pain, acute     Ortho - Dr. Nobie Putnam  MRI done 02/07/2021 showed full thickness insertional tear of the supraspinatus tendon with retraction of the bursal  surface tendon fibers by 3mm and retraction of the articular surface tendon fibers by up to 51mm.      There is partial thickness intrasubstance insertional tear of the subscapularis tendon. Anterior and inferior labral tear and long head biceps tendinosi    Superior glenoid labrum lesion of shoulder     Unilateral inguinal hernia without obstruction or gangrene, recurrence not specified 12/16/2017     Past Surgical History:   Procedure Laterality Date    HERNIA REPAIR      HERNIORRHAPHY, INGUINAL Left 12/16/2017    Procedure: HERNIORRHAPHY, REPAIR DIRECT INGUINAL WITH MESH;  Surgeon: Tommas Olp, MD;  Location: [facility];  Service: General;  Laterality: Left;    R shoulder surgery Right 04/01/2021    SKIN BIOPSY      chest    TONSILLECTOMY  age 64    TOOTH EXTRACTION Left 03/2018    left upper molar    [place]      25 yrs ago    [place]  age 89     Immunization History   Administered Date(s) Administered    COVID-19 mRNA BIVALENT vaccine 12 years and above [employer]) 30 mcg/0.3 mL 11/23/2020    COVID-19 mRNA MONOVALENT vaccine PRIMARY SERIES 12 years and above [employer]) 30 mcg/0.3 mL (DILUTE BEFORE USE) 03/30/2019, 04/20/2019, 12/13/2019    COVID-19 mRNA MONOVALENT vaccine PRIMARY SERIES 12 years and above [employer]) 30 mcg/0.3 mL (DO NOT DILUTE) 06/28/2020    Hepatitis A vaccine (adult dose) 06/30/2013, 01/06/2018    INFLUENZA HIGH DOSE 65 YRS+ 11/23/2020    Influenza vaccine quadrivalent MDCK, 6 months and older (FLUCELVAX), single-dose preservative free, 0.5 mL 01/09/2019    Influenza vaccine quadrivalent high-dose 65 years and older (FLUZONE HIGH-DOSE) single dose 0.7 mL (PF) 12/28/2021    Influenza vaccine quadrivalent recombinant 18 years and older (FLUBOK) single dose 0.5 mL (PF) 12/08/2019    Influenza vaccine, quadrivalent, 3 years and older (FLUZONE), single-dose preservative free, 0.5 mL 01/06/2018    Meningococcal polysaccharide MPSV4  06/30/2013    Pneumococcal Conjugate 20-Valent 03/21/2021    Tdap (tetanus, diphtheria reduced, acellular pertussis), adsorbed vaccine 06/30/2013, 02/14/2019    Typhoid vaccine 06/30/2013    Yellow Fever vaccine 06/30/2013  Zoster [facility]) vaccine, recombinant 02/27/2020, 05/21/2020     Health Maintenance   Topic Date Due    Advance Directive on File  Never done    Colon Cancer Screening Cologuard  12/14/2020    COVID-19 Vaccine (6 - 2023-24 season) 09/26/2021    DEPRESSION SCREENING  04/23/2023    Tetanus Ten-Year  02/13/2029    HEPATITIS C SCREENING  Completed    INFLUENZA VACCINE  Completed    Shingrix Vaccine 50+  Completed    Pneumonia Vaccine Age 35+  Completed    COLONOSCOPY TEN YEARS  Discontinued   :  Current Outpatient Medications   Medication Sig Dispense Refill    Ascorbic Acid (VITAMIN C) 1000 MG tablet Take 1 tablet (1,000 mg) by mouth daily      aspirin EC 81 MG EC tablet Take 1 tablet (81 mg) by mouth daily      CALCIUM-MAGNESIUM PO Take 1 capsule by mouth daily      Cholecalciferol (VITAMIN D3 PO) Take 5,000 IU by mouth daily         Coenzyme Q10 (CoQ-10) 100 MG Cap Take 1 capsule (100 mg) by mouth daily      COLOSTRUM PO Take 1,000 mg by mouth twice a week         ECHINACEA PO Take 400 mg by mouth as needed         Fiber Powder Take 1 scoop. by mouth daily Prebio Thrive      Fluticasone Propionate (FLONASE NA) by Nasal route as needed      Glucosamine HCl-MSM (GLUCOSAMINE-MSM PO) Take 1 capsule by mouth daily      loratadine (CLARITIN) 10 MG tablet Take 1 tablet (10 mg) by mouth as needed for Allergies      Omega-3 Fatty Acids (OMEGA 3 PO) Take 1,000 mg by mouth daily          Protein Powder Take by mouth daily Plant-based      Taurine 1000 MG Cap Take 1 capsule (1,000 mg) by mouth daily      UNABLE TO FIND Take 1 scoop. by mouth daily Med Name: Vital Reds (contains polyphenols 1760mg , Metabolic enhancing blend [id number] mg, Digestive support blend 3 billion CFUs        UNABLE TO FIND Take 1 Scoop by  mouth daily Med Name: Primal Plants: greens blend [id number] mg, Metbolic enhancing 415mg , Digestive support 1350 mg, Probiotic blend 3 billion CFU      losartan (COZAAR) 25 MG tablet Take 1 tablet (25 mg) by mouth daily 90 tablet 3    tamsulosin (FLOMAX) 0.4 MG Cap Take 1 capsule (0.4 mg) by mouth Daily after dinner 90 capsule 3     No current facility-administered medications for this visit.     Allergies   Allergen Reactions    Dust Mite Mixed Allergen Ext [Mite (D. Farinae)]     Other      Ragweed, seasonal allergies, animal dander     Social History     Socioeconomic History    Marital status: Married    Number of children: 2   Tobacco Use    Smoking status: Some Days     Types: Cigars    Smokeless tobacco: Never    Tobacco comments:     occasional cigar once or twice a month   Vaping Use    Vaping status: Never Used   Substance and Sexual Activity    Alcohol use: Yes     Alcohol/week: 4.0  standard drinks of alcohol     Types: 4 Drinks containing 0.5 oz of alcohol per week     Comment: 4 glasses/week; wine or liquor    Drug use: Never    Sexual activity: Not Currently     Partners: Female     Birth control/protection: Surgical     Social Determinants of Health     Financial Resource Strain: Low Risk  (04/21/2022)    Overall Financial Resource Strain (CARDIA)     Difficulty of Paying Living Expenses: Not hard at all   Food Insecurity: No Food Insecurity (04/21/2022)    Hunger Vital Sign     Worried About Running Out of Food in the Last Year: Never true     Ran Out of Food in the Last Year: Never true   Transportation Needs: No Transportation Needs (04/21/2022)    PRAPARE - Armed forces logistics/support/administrative officer (Medical): No     Lack of Transportation (Non-Medical): No   Physical Activity: Sufficiently Active (04/21/2022)    Exercise Vital Sign     Days of Exercise per Week: 4 days     Minutes of Exercise per Session: 40 min   Stress: No Stress Concern Present (04/21/2022)    [place]     Feeling of Stress : Only a little   Social Connections: Moderately Integrated (04/21/2022)    Social Connection and Isolation Panel [NHANES]     Frequency of Communication with Friends and Family: More than three times a week     Frequency of Social Gatherings with Friends and Family: Twice a week     Attends Religious Services: Never     Marine scientist or Organizations: Yes     Attends Music therapist: More than 4 times per year     Marital Status: Married   Human resources officer Violence: Not At Risk (04/21/2022)    Humiliation, Afraid, Rape, and Kick questionnaire     Fear of Current or Ex-Partner: No     Emotionally Abused: No     Physically Abused: No     Sexually Abused: No   Housing Stability: Low Risk  (04/21/2022)    Housing Stability Vital Sign     Unable to Pay for Housing in the Last Year: No     Number of [place] in the Last Year: 1     Unstable Housing in the Last Year: No     Family History   Problem Relation Age of Onset    Cancer Mother         Bladder    Stroke Mother     Heart disease Father     Dementia Father     Melanoma Father     Myopathy Father     No known problems Sister     No known problems Brother     Obesity Maternal Grandmother         overweight    No known problems Maternal Grandfather     Alzheimer's disease Paternal Grandmother     Heart disease Paternal Grandfather     Heart attack Paternal Grandfather     Diabetes type I Daughter     Thyroid disease Daughter         s/p ablation    No known problems Son      Review of Systems  CONST: No weight change, no fevers/chills sweats, or fatigue  EYES: No blurry vision, double vision, loss of peripheral vision   EARS: No perceived hearing loss, tinnitus, pain or discharge.   NOSE: Doing better with allergies this season.  MOUTH: No sore throat, oral lesions, difficulty swallowing  NECK: No swollen glands, stiffness or pain  CV: No CP, palpitations, leg swelling, changes in exercise  tolerance - see HPI  RESP: No SOB, cough, wheeze, asthma  GI: No n/v, diarrhea, constipation, abd pain, heartburn, blood in stool  MALE GU: No dysuria, frequency, hematuria, testicular changes, penile discharge, erectile dysfunction. Not sexually active currently due to wife's back pain issues. Nocturia is about once a night about 3-4 times a week. Urinary stream is improved on tamsulosin.  MSK:  R shoulder has healed. Mild ache at night but improved. Base of thumb on bilateral hands are stiff and sore in the mornings.   SKIN:   No rashes lumps, sores or concerning moles.   ENDO:  No polyuria, polydipsia, polyphagia  NEURO:  No dizziness, LOC, numbness/tingling, weakness. Mild headache transiently in the morning.   PSYCH:  No depressed mood, anhedonia, anxiety    Physical Exam:  BP 125/79 (BP Site: Left arm, Patient Position: Sitting, Cuff Size: Large)   Pulse (!) 48   Temp 97.6 F (36.4 C) (Temporal)   Ht 1.783 m (5' 10.2")   Wt 82.2 kg (181 lb 4.8 oz)   SpO2 97%   BMI 25.87 kg/m   Wt Readings from Last 3 Encounters:   04/23/22 82.2 kg (181 lb 4.8 oz)   03/03/21 83.7 kg (184 lb 9.6 oz)   02/27/20 82.9 kg (182 lb 11.2 oz)     GEN: well developed well nourished male in no apparent distress  HENT: NCAT, tympanic membrane normal bilaterally, no nasal congestion, oropharynx normal w no exudate or erythema  EYES: PERRL, EOMI, no pallor or scleral icterus, normal conjunctiva  NECK: supple, no thyromegaly or nodules appreciated  LYMPH: no cervical or supraclavicular LAD appreciated  CV: RRR, no m/r/g.  No LE edema.  2+ radial pulses present and equal.  RESP: CTAB, normal effort, no rhonchi or rales  GI: soft, nontender/ nondistended, NABS, no rebound or guarding, no evident HSM  GU: NEMG no masses or hernia evident or palpable. Healed scar from prior.   SKIN: warm, dry.  no visible rashes. No concerning lesions on face. Fair skin with photo-damage on sun exposed areas.   MSK: Normal bulk and tone, normal strength  5/5 and sensation UE / LE. R shoulder is well healed with completely healed scars.  NEURO: PERRLA, EOMI, face symmetric, hearing intact to voice, palate raise, shoulder shrug and neck flexion intact, tongue midline. MAE.  PSYCH: normal mood and affect    Audiology test:  machine is down currently.  Vision test:  reviewed results with patient   InBody: Reviewed results of InBody with patient - notable for BMI 25.8 Percentage Body Fat 18.9 and Visceral Fat 6. Good muscle tone and mass.   EKG: Sinus brady at 49 with normal morphology.    Assessment/Plan: It is a pleasure seeing Joshua Meyers. He looks to be in great shape, has gained muscle, remains active, eats healthy and his only vice is that of one cigar a month. His blood pressure is under good control and his urinary symptoms have improved. We discussed long term health and implications of elevated Lp(a) which cannot be improved by diet and exercise alone. Based on his history and age, appropriate labs have been sent and  we will review those for any additional recommendations.     1. Vitamin B12 deficiency  Historical.   - Vitamin B12    2. Hyperlipidemia  - CRP, High Sensitive  - Uric acid  - Lipid panel    3. Elevated Lp(a)  Given coronary calcium score, elevated Lp(a), goal LDL is 70 or below. However, his father was thought to have developed inclusion body myositis and dementia - attributed to statin - his wife had shared this with me also. Hence, he is reluctant to go on a statin. He finally has an appointment to see Dr. Zetta Bills in our program.    He has reportedly had an echo done in the past but I do not see that in records. I shared with him that I do recommend treatment to lower LDL with alternate agents - we can wait for Dr. Zetta Bills visit. The idea is for prevention of cardiovascular events such as stroke and heart attacks. I also shared that Dr. Zetta Bills will likely also recommend an echo which is reasonable to proceed with since I can't find a prior echo in our  system.   - Lipid panel    4. Primary hypertension  He has history of palpitations in relation to an episode of Graves, which was triggered after a marathon run. He does not have any concerning symptoms at this time. Continue current dose of Losartan.  - Uric acid  - Urine Microalbumin Random  - Hepatic function panel (LFT)  - Basic Metabolic Panel  - CBC and differential    5. Vitamin D deficiency  - Vitamin D,25 OH, Total    6. Prostate cancer screening  - PROSTATE SPECIFIC ANTIGEN SCREEN    7. Benign prostatic hyperplasia with weak urinary stream  Improved on Tamsulosin - recommend continuing it.   - Urinalysis Reflex to Microscopic Exam- Reflex to Culture    8. Diabetes mellitus screening  - Hemoglobin A1C    9. History of Graves' disease  Last TSH was normal. Await repeat. Clinically, appears euthyroid.   - TSH, Abn Reflex to Free T4, Serum    10. Screening for AAA (abdominal aortic aneurysm)  D/w him that I recommend a one time screening - due to episodic cigar smoking - low suspicion but the test is without risk since it is an ultrasound screen. Order and instructions provided.   - [place] Abd Aortic Aneurysm Screening; Future    11. Annual physical exam  - Hepatic function panel (LFT)  - Basic Metabolic Panel  - CBC and differential  - ECG 12 lead (No Charge)    We discussed the following -    - he has not yet scheduled his colonoscopy. Last Cologuard was in 2019. I do recommend a colonoscopy and he plans to call the GI team back soon. He is hesitant about anesthesia. He has had one episode of rectal bleeding in the last year that he attributed to hemorrhoids. Nevertheless, colonoscopy is preferred over stool based test. He will notify me if he changes his mind and decides not to do scope - then, will proceed with Cologuard although my preference and recommendation is for a colonoscopy. Referral to GI was already initiated last year. He will call back.     - he is eligible for a repeat COVID booster due to  age.    - encourage him to see dentist at least twice a year - he has good dentition overall but is overdue to see one.    -  encourage him to see a dermatologist at least once a year due to family history of melanoma even without any concerning lesions, a total body exam by a dermatologist is recommended.    - similarly, annual eye exam with dilated retinal exam and eye pressure checks are recommended. He is overdue for this also. Last about two years ago.    He is commended for staying in shape and staying active.       Orders Placed This Encounter   Procedures    [place] Abd Aortic Aneurysm Screening    Hemolysis index         Louellen Molder, MD, [facility] [place], [id number]  [place], [place] [address]  P) [phone]  F) [phone]  [email or web address]

## 2022-04-23 NOTE — Progress Notes (Signed)
Fitness Evaluation:   Weight  2024 = 181.3 lb  2023 = 184.6 lb    Body Fat as percentage: In men, over 25% is obese, 20-25% is higher than normal, 16-20% is healthy / normal, <16% or under is considered lean / ideal.  2024 = 18.9%  2023 = 20.2%    Visceral Fat: Abdominal "belly" fat.Visceral fat is a type of body fat that exists in the abdomen and surrounds the internal organs. Everyone has some, especially those who are sedentary, chronically stressed, or maintain unhealthy diets. A different type of fat -- subcutaneous fat -- which builds up under the skin, has less of a negative impact on health and is easier to lose than visceral fat. A high level of visceral fat can increase your risk for serious health problems including cardiovascular disease, types 2 diabetes, and increased blood pressure.    2024 = 6 (normal <10)  2023 = 7    InBody:  InBody results reviewed in detail and compared to prior results    Audiology test:   Audiology Exam not performed, testing unavailable    Vision Test:   Abnormal Vision Test today, recommended patient see Optometrist/Ophthalmologist    Testing:  Labs were drawn from left [facility] without difficulty. Aseptic technique utilized. No complications noted. Attempts x 1. Dressing applied. Patient tolerated well. Urine collected at this visit. Patient instructed on clean-catch technique per protocol. Patient verbalized understanding..     The below labs were sent to [facility] refrigerated;  one (1) Gold/SST Tube(s) - centrifuged solely.  two (2) Lavender (EDTA)Tube(s) - whole blood sent.  One (1) Urinalysis Tube  one (1) Urine Culture Tube   One Urine Micro albumin Clear Tube collected    Immunizations:  Immunization record is up to date. [employer] ([facility]) was checked for any missing immunization record.    Advanced Directive:  Patient has Advanced Directive in place, asked to send [place] a copy via email/MyChart/Hard copy.    Health Maintenance:  Colonoscopy:  overdue  Next annual exam scheduled for

## 2022-04-24 ENCOUNTER — Encounter: Payer: Self-pay | Admitting: Hospitalist

## 2022-04-24 NOTE — Progress Notes (Signed)
Dear Mr. Dannemiller,     It was really good to see you yesterday. Lab work has now resulted. I have reviewed them.    All comprehensive results of blood count, uric acid, kidney, electrolytes, B12, PSA, vitamin D, thyroid function, inflammatory marker CRP, liver labs are excellent EXCEPT for the cholesterol. This is not due to lack of trying on your part, you are in a great shape and you eat healthy - as we discussed, you also have the high Lp(a) which won't come down with diet or exercise. Additionally, you already have evidence of atherosclerosis on coronary CT scan as we have discussed. Putting your cholesterol and blood pressure numbers on American Heart Association's risk predictor gives you a 10 year risk of 20.7% which in my opinion is too high and warrants treatment to bring the LDL down to 70. We need a 50% reduction from where you are now and that is not possible without the help of medication. I understand the concern about statin but I hope Dr. Zetta Bills will help you guide on other options - I recommend the bi-weekly injection which are quite effective in lowering the LDL and thereby greatly decreasing your overall risk for atherosclerotic vascular events such as stroke and heart attacks in the future.    Reference below.     The 10-year ASCVD risk score (Arnett DK, et al., 2019) is: 20.7%    Values used to calculate the score:      Age: 57 years      Sex: Male      Is Non-Hispanic African American: No      Diabetic: No      Tobacco smoker: Yes      Systolic Blood Pressure: 0000000 mmHg      Is BP treated: Yes      HDL Cholesterol: 51 mg/dL      Total Cholesterol: 210 mg/dL     Sorry for the long note - but wanted to give you this context prior to your visit with the cardiologist.     The other to do as we discussed for this year are to complete your colon cancer screening and aortic aneurysm screening with an ultrasound.    Please don't hesitate to reach out to me or my RN if you need any  assistance.    Cindee Lame, MD

## 2022-04-24 NOTE — Progress Notes (Signed)
Dear Mr. Preuss,     It was really good to see you yesterday. Lab work has now resulted. I have reviewed them.    All comprehensive results of blood count, uric acid, kidney, electrolytes, B12, PSA, vitamin D, thyroid function, inflammatory marker CRP, liver labs are excellent EXCEPT for the cholesterol. This is not due to lack of trying on your part, you are in a great shape and you eat healthy - as we discussed, you also have the high Lp(a) which won't come down with diet or exercise. Additionally, you already have evidence of atherosclerosis on coronary CT scan as we have discussed. Putting your cholesterol and blood pressure numbers on American Heart Association's risk predictor gives you a 10 year risk of 20.7% which in my opinion is too high and warrants treatment to bring the LDL down to 70. We need a 50% reduction from where you are now and that is not possible without the help of medication. I understand the concern about statin but I hope Dr. Zetta Bills will help you guide on other options - I recommend the bi-weekly injection which are quite effective in lowering the LDL and thereby greatly decreasing your overall risk for atherosclerotic vascular events such as stroke and heart attacks in the future.    Reference below.     The 10-year ASCVD risk score (Arnett DK, et al., 2019) is: 20.7%    Values used to calculate the score:      Age: 8 years      Sex: Male      Is Non-Hispanic African American: No      Diabetic: No      Tobacco smoker: Yes      Systolic Blood Pressure: 0000000 mmHg      Is BP treated: Yes      HDL Cholesterol: 51 mg/dL      Total Cholesterol: 210 mg/dL     Sorry for the long note - but wanted to give you this context prior to your visit with the cardiologist.     The other to do as we discussed for this year are to complete your colon cancer screening and aortic aneurysm screening with an ultrasound.    Please don't hesitate to reach out to me or my RN if you need any  assistance.    Cindee Lame, MD

## 2022-04-29 ENCOUNTER — Ambulatory Visit (INDEPENDENT_AMBULATORY_CARE_PROVIDER_SITE_OTHER)
Payer: No Typology Code available for payment source | Admitting: Student in an Organized Health Care Education/Training Program

## 2022-04-29 ENCOUNTER — Encounter (INDEPENDENT_AMBULATORY_CARE_PROVIDER_SITE_OTHER): Payer: Self-pay | Admitting: Student in an Organized Health Care Education/Training Program

## 2022-04-29 VITALS — BP 122/74 | HR 56 | Ht 70.5 in | Wt 181.0 lb

## 2022-04-29 DIAGNOSIS — I10 Essential (primary) hypertension: Secondary | ICD-10-CM

## 2022-04-29 DIAGNOSIS — E785 Hyperlipidemia, unspecified: Secondary | ICD-10-CM

## 2022-04-29 DIAGNOSIS — I2510 Atherosclerotic heart disease of native coronary artery without angina pectoris: Secondary | ICD-10-CM

## 2022-04-29 NOTE — Patient Instructions (Addendum)
Bempedoic acid (CLEAR OUTCOME trial)  Ezetimibe (IMPROVE-IT trial); bempedoic acid often combined with this  Repatha and praluent (FOURIER and the ODYSSEY trial)  Inclisiran (ORION trials)  For statins, the cholesterol treatment trialist meta-analysis  Let me know if what if any you are interested in  Echocardiogram  CALL IF ANYTHING

## 2022-04-29 NOTE — Addendum Note (Signed)
Addended by: Gardiner Coins on: 04/29/2022 04:22 PM     Modules accepted: Orders

## 2022-04-29 NOTE — Progress Notes (Signed)
[facility] OFFICE CONSULTATION    I had the pleasure of seeing Joshua Meyers today for cardiovascular evaluation. He is a pleasant 67 y.o. male with a history of hyperlipidemia with borderline Lp(a) elevation, hypertension, coronary artery disease (based on CAC) who presents for evaluation.    The patient presents at the request of his PCP for evaluation of his lipids in the context of a borderline Lp(a) elevation and coronary artery calcification.    He has a strong family history of heart disease.  His father and paternal grandfather both had premature myocardial infarctions.     The patient himself has had long term moderate elevations of LDL-C.  He has an Lp(a) level in the grey zone.  He had a CAC score done showing a score of 141 (61st percentile).  He is on aspirin 81mg .    He has been offered statin treatment based on his CAC and elevated 10 year risk.  However, his father started a statin and was subsequently diagnosed with dementia and inclusion body myositis; he was told the statin may be implicated in both.    At baseline he is quite active and eats a heart healthy diet.  He denies exertional chest discomfort or dyspnea.  He has no palpitations or lightheadedness.  He has no heart failure symptoms.      He checks his BP at home and it is generally 120s/70s on losartan 25mg .    Past Medical History:   Diagnosis Date    Ankle fracture 1960s    Right ankle    Ankle fracture, left 1977    Arrhythmia 2001    dt Graves disease which resolved; Afib per [facility] history    Fracture     bilat ankles childhood    Graves disease 2001    Treated 2001    Hamstring injury 2001    Hernia, inguinal 2017    Left side    Hyperthyroidism 2001    Graves disease for 18 months; resolved on its own    Inguinal hernia 2019    left side    Pinched nerve     L3    Plantar fasciitis of right foot 2019    Sciatica 2016    Shoulder pain, acute     Ortho - Dr. Nobie Putnam  MRI done 02/07/2021 showed full thickness insertional  tear of the supraspinatus tendon with retraction of the bursal surface tendon fibers by 38mm and retraction of the articular surface tendon fibers by up to 71mm.      There is partial thickness intrasubstance insertional tear of the subscapularis tendon. Anterior and inferior labral tear and long head biceps tendinosi    Superior glenoid labrum lesion of shoulder     Unilateral inguinal hernia without obstruction or gangrene, recurrence not specified 12/16/2017     Past Surgical History:   Procedure Laterality Date    HERNIA REPAIR      HERNIORRHAPHY, INGUINAL Left 12/16/2017    Procedure: HERNIORRHAPHY, REPAIR DIRECT INGUINAL WITH MESH;  Surgeon: Tommas Olp, MD;  Location: [facility];  Service: General;  Laterality: Left;    R shoulder surgery Right 04/01/2021    SKIN BIOPSY      chest    TONSILLECTOMY  age 67    TOOTH EXTRACTION Left 03/2018    left upper molar    [place]      25 yrs ago  WISDOM TOOTH EXTRACTION  age 67     Family History   Problem Relation Age of Onset    Cancer Mother         Bladder    Stroke Mother     Heart disease Father     Dementia Father     Melanoma Father     Myopathy Father     No known problems Sister     No known problems Brother     Obesity Maternal Grandmother         overweight    No known problems Maternal Grandfather     Alzheimer's disease Paternal Grandmother     Heart disease Paternal Grandfather     Heart attack Paternal Grandfather     Diabetes type I Daughter     Thyroid disease Daughter         s/p ablation    No known problems Son      Social History     Tobacco Use    Smoking status: Some Days     Types: Cigars    Smokeless tobacco: Never    Tobacco comments:     occasional cigar once or twice a month   Vaping Use    Vaping status: Never Used   Substance Use Topics    Alcohol use: Yes     Alcohol/week: 4.0 standard drinks of alcohol     Types: 4 Drinks containing 0.5 oz of alcohol per week     Comment: 4  glasses/week; wine or liquor    Drug use: Never         MEDICATIONS:   Current Outpatient Medications   Medication Instructions    aspirin EC (ASPIRIN EC) 81 mg, Oral, Daily    CALCIUM-MAGNESIUM PO 1 capsule, Oral, Daily    Cholecalciferol (VITAMIN D3 PO) 5,000 IU, Oral, Daily    Coenzyme Q10 (CoQ-10) 100 MG Cap 1 capsule, Oral, Daily    COLOSTRUM PO 1,000 mg, Oral, twice weekly    ECHINACEA PO 400 mg, Oral, As needed    Fiber Powder 1 scoop., Oral, Daily, Prebio Thrive     Fluticasone Propionate (FLONASE NA) Nasal, As needed    Glucosamine HCl-MSM (GLUCOSAMINE-MSM PO) 1 capsule, Oral, Daily    loratadine (CLARITIN) 10 mg, Oral, As needed    losartan (COZAAR) 25 mg, Oral, Daily    Omega-3 Fatty Acids (OMEGA 3 PO) 1,000 mg, Oral, Daily    Protein Powder Oral, Daily, Plant-based    tamsulosin (FLOMAX) 0.4 mg, Oral, Daily after dinner    Taurine 1000 MG Cap 1 capsule, Oral, Daily    UNABLE TO FIND 1 scoop., Oral, Daily, Med Name: Vital Reds (contains polyphenols 1760mg , Metabolic enhancing blend [id number] mg, Digestive support blend 3 billion CFUs    UNABLE TO FIND 1 Scoop, Oral, Daily, Med Name: Primal Plants: greens blend [id number] mg, Metbolic enhancing 415mg , Digestive support 1350 mg, Probiotic blend 3 billion CFU     vitamin C 1,000 mg, Oral, Daily       ALLERGIES:   Allergies   Allergen Reactions    Dust Mite Mixed Allergen Ext [Mite (D. Farinae)]     Other      Ragweed, seasonal allergies, animal dander       REVIEW OF SYSTEMS: All other systems reviewed and negative except as above.     PHYSICAL EXAMINATION  Health Related Quality of Life:    Vital Signs: Ht 1.791 m (5' 10.5")   Wt 82.1 kg (  181 lb)   BMI 25.60 kg/m    General Appearance:  A well-appearing male in no acute distress.    HEENT: Sclera anicteric, conjunctiva without pallor, moist mucous membranes, normal dentition. No arcus.   Neck:  Supple. Thyroid nonpalpable. Normal carotid upstrokes without bruits.   Chest: Clear to auscultation bilaterally with good  air movement and respiratory effort and no wheezes or crackles.   Cardiovascular: RRR, normal S1 and S2, w/o m/r/g.  JVP is ~7cmH2O. PMI of normal size and nondisplaced.   Abdomen: Soft, nontender, nondistended, with normoactive bowel sounds. No organomegaly.  No pulsatile masses, or bruits.   Extremities: Warm and well perfused.  No edema bilaterally. All peripheral pulses are full and equal.   Skin: No rash, xanthoma or xanthelasma.   Neuro: Alert and oriented x3. Grossly intact. Strength is symmetrical. Normal mood and affect are appropriate.     ECG: March 2023: Sinus bradycardia, otherwise normal, no prior.    LABS:   Lab Results   Component Value Date    WBC 6.10 04/23/2022    HGB 15.5 04/23/2022    HCT 44.8 04/23/2022    PLT 232 04/23/2022    NA 139 04/23/2022    K 4.7 04/23/2022    BUN 17.0 04/23/2022    CREAT 1.1 04/23/2022    GLU 96 04/23/2022    CHOL 210 (H) 04/23/2022    TRIG 80 04/23/2022    HDL 51 04/23/2022    LDL 143 (H) 04/23/2022    AST 23 04/23/2022    ALT 24 04/23/2022    HGBA1C 5.1 04/23/2022    TSH 1.65 04/23/2022        ECHOCARDIOGRAM: Pending    CAC:  Jan 2023:  IMPRESSION:   1. Calcium score 141, 61% of asymptomatic patients matched for same sex and  age have a lower calcium score.    IMPRESSION/RECOMMENDATIONS: Mr. Dinucci is a 67 y.o. male with the above.    Overall he has mild coronary artery disease with no clear ischemic symptoms.    His lifestyle factors are excellent.    He is appropriately on aspirin 81mg  given CAC >100.    We discussed his cholesterol, the significance of Lp(a), and his family history.  We discussed that given all of this, we would like to see his LDL-C 70mg /dL.    We discussed at length the data behind statins and the association with neurocognitive changes versus dementia and typical statin associated muscle symptoms.  We also discussed non-statin alternatives including bempedoic acid, ezetimibe, PCSK9 mAb, and PCSK9 siRNA therapy including the risk/benefit of all  of them and the potential need for combination therapy.     He was given the names of the medications and relevant studies for review per his request.  He will let me know if he is amenable to any of the above options.  At that point, we can start and get repeat blood work after to assess the need for additional therapy.    Lastly, given the mild Lp(a) elevation and his CAD, we will get a TTE.    His BP is well controlled.    #Coronary artery disease  #Hyperlipidemia  #Elevated Lp(a)  -Continue aspirin 81mg   -Consider statin versus non-statin alternative and start pending decision  -TTE    #Hypertension  -Continue losartan    Stevenson Clinch, MD

## 2023-02-08 ENCOUNTER — Telehealth: Payer: Self-pay

## 2023-02-08 NOTE — Telephone Encounter (Signed)
 Called Pt to schedule time for his labs prior to his physical on 2/4. Scheduled Pt for 8:00 am nurse visit on 1/28. Pt also wanted to leave note for Dr. Missie that his wife's back surgery this morning went without any complications and to call him with any questions.

## 2023-02-16 ENCOUNTER — Telehealth: Payer: Self-pay

## 2023-02-16 NOTE — Telephone Encounter (Signed)
 Patient called to reschedule physical on 2/4. Informed him the next available wouldn't be until April. He is wondering if the time can be pushed back as he will be on train coming back [facility]. Will take 7AM train so should be back in time for 11AM exam start. Verfieid with Dr. Missie okay to do MD portion start at 11AM then will come on 1/28 as previously scheduled blood work but will complete whole nurse portion instead. He declines hearing and vision. for nurse portion. On 2/4 all he needs in vital signs and then meet with the MD. Patient verbalized understanding and informed him the changes will be reflected in MyChart.

## 2023-02-23 ENCOUNTER — Ambulatory Visit: Payer: No Typology Code available for payment source

## 2023-03-02 ENCOUNTER — Encounter: Payer: No Typology Code available for payment source | Admitting: Hospitalist

## 2023-03-15 ENCOUNTER — Other Ambulatory Visit: Payer: Self-pay | Admitting: Hospitalist

## 2023-03-15 DIAGNOSIS — R3912 Poor urinary stream: Secondary | ICD-10-CM

## 2023-03-15 DIAGNOSIS — Z131 Encounter for screening for diabetes mellitus: Secondary | ICD-10-CM

## 2023-03-15 DIAGNOSIS — E538 Deficiency of other specified B group vitamins: Secondary | ICD-10-CM

## 2023-03-15 DIAGNOSIS — Z5181 Encounter for therapeutic drug level monitoring: Secondary | ICD-10-CM

## 2023-03-15 DIAGNOSIS — Z1329 Encounter for screening for other suspected endocrine disorder: Secondary | ICD-10-CM

## 2023-03-15 DIAGNOSIS — N401 Enlarged prostate with lower urinary tract symptoms: Secondary | ICD-10-CM

## 2023-03-15 DIAGNOSIS — Z8639 Personal history of other endocrine, nutritional and metabolic disease: Secondary | ICD-10-CM

## 2023-03-15 DIAGNOSIS — E7800 Pure hypercholesterolemia, unspecified: Secondary | ICD-10-CM

## 2023-03-15 DIAGNOSIS — Z125 Encounter for screening for malignant neoplasm of prostate: Secondary | ICD-10-CM

## 2023-03-15 DIAGNOSIS — E7841 Elevated Lipoprotein(a): Secondary | ICD-10-CM

## 2023-03-15 DIAGNOSIS — I10 Essential (primary) hypertension: Secondary | ICD-10-CM

## 2023-03-15 DIAGNOSIS — E559 Vitamin D deficiency, unspecified: Secondary | ICD-10-CM

## 2023-03-16 ENCOUNTER — Ambulatory Visit (FREE_STANDING_LABORATORY_FACILITY): Payer: No Typology Code available for payment source

## 2023-03-16 DIAGNOSIS — Z1329 Encounter for screening for other suspected endocrine disorder: Secondary | ICD-10-CM

## 2023-03-16 DIAGNOSIS — R3912 Poor urinary stream: Secondary | ICD-10-CM

## 2023-03-16 DIAGNOSIS — I10 Essential (primary) hypertension: Secondary | ICD-10-CM

## 2023-03-16 DIAGNOSIS — E7800 Pure hypercholesterolemia, unspecified: Secondary | ICD-10-CM

## 2023-03-16 DIAGNOSIS — Z8639 Personal history of other endocrine, nutritional and metabolic disease: Secondary | ICD-10-CM

## 2023-03-16 DIAGNOSIS — E559 Vitamin D deficiency, unspecified: Secondary | ICD-10-CM

## 2023-03-16 DIAGNOSIS — Z131 Encounter for screening for diabetes mellitus: Secondary | ICD-10-CM

## 2023-03-16 DIAGNOSIS — E538 Deficiency of other specified B group vitamins: Secondary | ICD-10-CM

## 2023-03-16 DIAGNOSIS — E7841 Elevated Lipoprotein(a): Secondary | ICD-10-CM

## 2023-03-16 DIAGNOSIS — N401 Enlarged prostate with lower urinary tract symptoms: Secondary | ICD-10-CM

## 2023-03-16 LAB — LAB USE ONLY - CBC WITH DIFFERENTIAL
Absolute Basophils: 0.06 10*3/uL (ref 0.00–0.08)
Absolute Eosinophils: 0.21 10*3/uL (ref 0.00–0.44)
Absolute Immature Granulocytes: 0.04 10*3/uL (ref 0.00–0.07)
Absolute Lymphocytes: 1.52 10*3/uL (ref 0.42–3.22)
Absolute Monocytes: 0.51 10*3/uL (ref 0.21–0.85)
Absolute Neutrophils: 3.21 10*3/uL (ref 1.10–6.33)
Absolute nRBC: 0 10*3/uL (ref <=0.00)
Basophils %: 1.1 %
Eosinophils %: 3.8 %
Hematocrit: 40.9 % (ref 37.6–49.6)
Hemoglobin: 14.2 g/dL (ref 12.5–17.1)
Immature Granulocytes %: 0.7 %
Lymphocytes %: 27.4 %
MCH: 30.8 pg (ref 25.1–33.5)
MCHC: 34.7 g/dL (ref 31.5–35.8)
MCV: 88.7 fL (ref 78.0–96.0)
MPV: 11.3 fL (ref 8.9–12.5)
Monocytes %: 9.2 %
Neutrophils %: 57.8 %
Platelet Count: 211 10*3/uL (ref 142–346)
Preliminary Absolute Neutrophil Count: 3.21 10*3/uL (ref 1.10–6.33)
RBC: 4.61 10*6/uL (ref 4.20–5.90)
RDW: 13 % (ref 11–15)
WBC: 5.55 10*3/uL (ref 3.10–9.50)
nRBC %: 0 /100{WBCs} (ref <=0.0)

## 2023-03-16 LAB — HEPATIC FUNCTION PANEL (LFT)
ALT: 26 U/L (ref <=55)
AST (SGOT): 26 U/L (ref <=41)
Albumin/Globulin Ratio: 1.4 (ref 0.9–2.2)
Albumin: 4 g/dL (ref 3.5–5.0)
Alkaline Phosphatase: 51 U/L (ref 37–117)
Bilirubin Direct: 0.3 mg/dL (ref 0.0–0.5)
Bilirubin Indirect: 0.5 mg/dL (ref 0.2–1.0)
Bilirubin, Total: 0.8 mg/dL (ref 0.2–1.2)
Globulin: 2.8 g/dL (ref 2.0–3.6)
Hemolysis Index: 6 {index}
Protein, Total: 6.8 g/dL (ref 6.0–8.3)

## 2023-03-16 LAB — URINALYSIS WITH REFLEX TO MICROSCOPIC EXAM - REFLEX TO CULTURE
Urine Bilirubin: NEGATIVE
Urine Blood: NEGATIVE
Urine Glucose: NEGATIVE
Urine Ketones: NEGATIVE mg/dL
Urine Leukocyte Esterase: NEGATIVE
Urine Nitrite: NEGATIVE
Urine Protein: NEGATIVE
Urine Specific Gravity: 1.02 (ref 1.001–1.035)
Urine Urobilinogen: NORMAL mg/dL (ref 0.2–2.0)
Urine pH: 5.5 (ref 5.0–8.0)

## 2023-03-16 LAB — LAB USE ONLY - URINE GRAY CULTURE HOLD TUBE

## 2023-03-16 LAB — BASIC METABOLIC PANEL
Anion Gap: 7 (ref 5.0–15.0)
BUN: 14 mg/dL (ref 9–28)
CO2: 24 meq/L (ref 17–29)
Calcium: 9.4 mg/dL (ref 8.5–10.5)
Chloride: 109 meq/L (ref 99–111)
Creatinine: 1.1 mg/dL (ref 0.5–1.5)
GFR: >60 mL/min/{1.73_m2} (ref >=60.0)
Glucose: 91 mg/dL (ref 70–100)
Hemolysis Index: 6 {index}
Potassium: 4.5 meq/L (ref 3.5–5.3)
Sodium: 140 meq/L (ref 135–145)

## 2023-03-16 LAB — URIC ACID: Uric Acid: 5.6 mg/dL (ref 3.6–9.7)

## 2023-03-16 LAB — LIPID PANEL
Cholesterol / HDL Ratio: 3.8 {index}
Cholesterol: 196 mg/dL (ref <=199)
HDL: 52 mg/dL (ref >=40)
LDL Calculated: 132 mg/dL — ABNORMAL HIGH (ref 0–99)
Triglycerides: 59 mg/dL (ref 34–149)
VLDL Calculated: 12 mg/dL (ref 10–40)

## 2023-03-16 LAB — HEMOGLOBIN A1C
Average Estimated Glucose: 105.4 mg/dL
Hemoglobin A1C: 5.3 % (ref 4.6–5.6)

## 2023-03-16 LAB — ECG 12-LEAD, NO CHARGE
Atrial Rate: 38 {beats}/min
P Axis: 67 degrees
P-R Interval: 200 ms
Q-T Interval: 484 ms
QRS Duration: 74 ms
QTC Calculation (Bezet): 384 ms
R Axis: 65 degrees
T Axis: 75 degrees
Ventricular Rate: 38 {beats}/min

## 2023-03-16 LAB — URINE MICROALBUMIN, RANDOM
Urine Creatinine: 143 mg/dL
Urine Microalbumin/Creatinine Ratio: <5 ug/mg (ref <30)
Urine Microalbumin: 5 ug/mL (ref 0.0–30.0)

## 2023-03-16 LAB — THYROID STIMULATING HORMONE (TSH) WITH REFLEX TO FREE T4: TSH: 2.25 u[IU]/mL (ref 0.35–4.94)

## 2023-03-16 LAB — PSA TOTAL, ANNUAL SCREENING: Prostate Specific Antigen, Total: 0.55 ng/mL (ref 0.000–4.000)

## 2023-03-16 LAB — C-REACTIVE PROTEIN HIGH SENSITIVE: High Sensitivity C-Reactive Protein: 0.03 mg/dL (ref 0.00–1.00)

## 2023-03-16 LAB — VITAMIN B12: Vitamin B-12: 531 pg/mL (ref 211–911)

## 2023-03-16 LAB — VITAMIN D, 25 OH, TOTAL: Vitamin D 25-OH, Total: 52 ng/mL (ref 30–100)

## 2023-03-16 NOTE — Progress Notes (Signed)
 Fitness Evaluation:   Skeletal Muscle Mass [facility]):   2025= 83.8 lbs.  2024= 84.2 lbs.  Percent Body Fat (PBF):   2025= 18.6%  2024= 18.9%  Visceral Fat:   2025= 6 (normal <10)  Abdominal belly fat.Visceral fat is a type of body fat that exists in the abdomen and surrounds the internal organs. Everyone has some, especially those who are sedentary, chronically stressed, or maintain unhealthy diets. A different type of fat -- subcutaneous fat -- which builds up under the skin, has less of a negative impact on health and is easier to lose than visceral fat. A high level of visceral fat can increase your risk for serious health problems including cardiovascular disease, types 2 diabetes, and increased blood pressure.      Patient Education:   Reviewed InBody, current exercise regime, and diet with patient. Noted that SMM had decreased and PBF had decreased since last InBody.     Pt reports working out between 3-6 days a week, as his schedule allows. Reports performing strengthening yoga primarily focusing on his core.    Audiology test:   Hearing test not performed today.    Vision test:   Vision screening test not performed today as pt visits ophthalmologist routinely/yearly.    Testing:  Labs were drawn from left [facility] without difficulty.. Aseptic technique utilized. No complications noted. Dressing applied. Patient tolerated well. Urine collected at this visit. Patient instructed on clean-catch technique per protocol. Patient verbalized understanding..     The below labs were sent to [facility] refrigerated;  two (2) Gold/SST Tube(s) - collection : centrifuged solely.  one (1) Lavender (EDTA)Tube(s) - collection : whole blood sent.  One (1) Urinalysis Tube  One (1) Urine Culture Tube  One (1) Urine Clear Tube    ECG:  ECG performed and reviewed by MD.    Immunizations:  Vaccines not UTD. Due for COVID .  No vaccines administered.    Health Maintenance:  Colon Cancer Screening: Screening due, pt does not currently have plans to  complete and needs referral.  Depression Screening: Completed in office.    Other Patient Notes:  has an advance directive - a copy HAS NOT been provided. Requested to provide copy.SABRA

## 2023-03-18 ENCOUNTER — Ambulatory Visit: Payer: No Typology Code available for payment source

## 2023-03-18 LAB — LIPOPROTEIN A (LPA): Lipoprotein (a): 89 nmol/L — ABNORMAL HIGH (ref <75)

## 2023-03-18 LAB — APOLIPOPROTEIN B: Apolipoprotein B: 101 mg/dL — ABNORMAL HIGH

## 2023-03-24 ENCOUNTER — Ambulatory Visit (INDEPENDENT_AMBULATORY_CARE_PROVIDER_SITE_OTHER): Payer: No Typology Code available for payment source | Admitting: Hospitalist

## 2023-03-24 ENCOUNTER — Encounter: Payer: Self-pay | Admitting: Hospitalist

## 2023-03-24 VITALS — BP 120/80 | HR 49 | Temp 97.7°F | Resp 18 | Ht 70.2 in | Wt 181.5 lb

## 2023-03-24 DIAGNOSIS — Z136 Encounter for screening for cardiovascular disorders: Secondary | ICD-10-CM

## 2023-03-24 DIAGNOSIS — I10 Essential (primary) hypertension: Secondary | ICD-10-CM

## 2023-03-24 DIAGNOSIS — Z1211 Encounter for screening for malignant neoplasm of colon: Secondary | ICD-10-CM

## 2023-03-24 DIAGNOSIS — R399 Unspecified symptoms and signs involving the genitourinary system: Secondary | ICD-10-CM

## 2023-03-24 DIAGNOSIS — R001 Bradycardia, unspecified: Secondary | ICD-10-CM

## 2023-03-24 DIAGNOSIS — E7841 Elevated Lipoprotein(a): Secondary | ICD-10-CM

## 2023-03-24 DIAGNOSIS — Z0000 Encounter for general adult medical examination without abnormal findings: Secondary | ICD-10-CM

## 2023-03-24 MED ORDER — TAMSULOSIN HCL 0.4 MG PO CAPS
0.4000 mg | ORAL_CAPSULE | Freq: Every day | ORAL | 3 refills | Status: AC
Start: 2023-03-24 — End: ?

## 2023-03-24 MED ORDER — LOSARTAN POTASSIUM 25 MG PO TABS
25.0000 mg | ORAL_TABLET | Freq: Every day | ORAL | 3 refills | Status: AC
Start: 2023-03-24 — End: 2024-03-23

## 2023-03-24 NOTE — Progress Notes (Signed)
 Fitness Evaluation:   Body Fat as percentage: In men, over 25% is obese, 20-25% is higher than normal, 16-20% is healthy / normal, <16% or under is considered lean / ideal.  2025= 18.6%  2024= 18.9%    Visceral Fat: Abdominal belly fat.Visceral fat is a type of body fat that exists in the abdomen and surrounds the internal organs. Everyone has some, especially those who are sedentary, chronically stressed, or maintain unhealthy diets. A different type of fat -- subcutaneous fat -- which builds up under the skin, has less of a negative impact on health and is easier to lose than visceral fat. A high level of visceral fat can increase your risk for serious health problems including cardiovascular disease, types 2 diabetes, and increased blood pressure.    2025= 6 (normal <10).   2024 = 6    Current Exercise Program:  Strength: Weight training 3-4x/wk  Stretching: Strength yoga    Audiology test: Results reviewed with patient in detail.     Vision test:  Not performed. Pt was evaluated by Ophthalmology on 03/19/23.      Patient Education: Reviewed InBody with patient. Noted that pt continues to maintain with no major changes noted.

## 2023-03-24 NOTE — Progress Notes (Signed)
 Chief Complaint   Patient presents with    Annual Exam     History of Present Illness    Joshua Meyers is a 68 y.o. male here for evaluation and treatment of the following concerns: Annual Exam. [name] remains in good shape and did not have any major concerns. The last few months have been busy for them, mainly due to his wife's health and unexpected turns. She is recovering from her back surgery and also a respiratory infection. He had the same URI but did improve quickly.    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 He manages this with rolling ball, heat, warm jets. He also has arthritis/pain and stiffness of his hands. 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 He does type and uses his phone a lot and is moving to doing dictations on the phone. He is exercising his hand. He also has a mild Dupuytren's on his R hand.     He has had not concerning cardiopulmonary symptoms, his baseline heart rate runs low. He used to be an avid runner but does not run anymore. He reports good endurance, no chest pain or tightness, no palpitations, no syncope, no near syncope, no wheezing or shortness of breath, no PND or orthopnea. 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 Mom is in poor health but comfortable.     DIET- Tends to do intermittent fasting as he is not hungry in the morning, lunch is light and dinner is either outside (four days a week) or at home. He is focusing on getting more lean protein, plants, nuts, berries, and avoids red meat. Avoids processed foods. Limits alcohol to weekends and just doing 1-2 drinks then. One cigar a month for recreation.     EXERCISE:He used to be a runner but now does more walking - typically gets 4-5 miles daily. They also have a dog. He is working out four days a week doing strength yoga, planks etc.      SLEEP: 8 hours daily, no snoring unless dealing with allergies, feels refreshed when he wakes up. Does have nocturia about once but less so now.     Patient Active Problem List    Diagnosis Date Noted    Hypertension 03/03/2021     BP Readings from Last 3 Encounters:   03/03/21 (!) 135/91   02/07/21 139/80   02/27/20 136/82     Started Losartan  after 02/2021 visit.    Doing well on Losartan  25mg  daily  BP 125/79         Vitamin B12 deficiency 02/14/2019    Benign prostatic hyperplasia with weak urinary stream 02/14/2019     Symptoms improved on Tamsulosin       Primary osteoarthritis of both first carpometacarpal joints 02/14/2019    Pure hypercholesterolemia 09/14/2017     ASCVD risk is about 13% based on 2021 labs. Lower BP by lowering salt first.   Statin therapy will decrease that risk. He also has family history of CAD.   CAC score ordered.     Feb 2023 - CAC sore 141 - results in Epic. Based on MESA risk score 10 year ASCVD risk is 11.4%.   He has understandable hesitation about statins, his father developed inclusion body myositis attributed to statin.  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    Family history of heart disease 08/11/2017    Vitamin D  deficiency 08/11/2017     Past Medical History:   Diagnosis Date    Ankle fracture 1960s    Right ankle    Ankle fracture, left 1977    Arrhythmia 2001    dt Graves disease which resolved; Afib per [facility] history    Fracture     bilat ankles childhood    Graves disease 2001    Treated 2001    Hamstring injury 2001    Hernia, inguinal 2017    Left side    Hyperthyroidism 2001    Graves disease for 18 months; resolved on its own    Inguinal hernia 2019    left side    Pinched nerve     L3    Plantar fasciitis of right foot 2019    Sciatica 2016    Shoulder pain, acute     Ortho - Dr. Dennis Carlini  MRI done 02/07/2021 showed full thickness insertional tear of the supraspinatus tendon with retraction of  the bursal surface tendon fibers by 12mm and retraction of the articular surface tendon fibers by up to 16mm.      There is partial thickness intrasubstance insertional tear of the subscapularis tendon. Anterior and inferior labral tear and long head biceps tendinosi    Superior glenoid labrum lesion of shoulder     Unilateral inguinal hernia without obstruction or gangrene, recurrence not specified 12/16/2017     Past Surgical History[1]  Immunization History   Administered Date(s) Administered    COVID-19 mRNA BIVALENT vaccine 12 years and above [employer]) 30 mcg/0.3 mL 11/23/2020    COVID-19 mRNA MONOVALENT vaccine PRIMARY SERIES 12 years and above [employer]) 30 mcg/0.3 mL (DILUTE BEFORE USE) 03/30/2019, 04/20/2019, 12/13/2019    COVID-19 mRNA MONOVALENT vaccine PRIMARY SERIES 12 years and above [employer]) 30 mcg/0.3 mL (DO NOT DILUTE) 06/28/2020    Hepatitis A vaccine (adult dose) 06/30/2013, 01/06/2018    INFLUENZA TRIVALENT HIGH DOSE PF 65 YRS AND OLDER (FLUZONE HIGH-DOSE) 11/23/2020, 11/18/2022    Influenza vaccine quadrivalent MDCK, 6 months and older (FLUCELVAX), single-dose preservative free, 0.5 mL 01/09/2019    Influenza vaccine quadrivalent high-dose 65 years and older (FLUZONE HIGH-DOSE) single dose 0.7 mL (PF) 12/28/2021    Influenza vaccine quadrivalent recombinant 18 years and older (FLUBOK) single dose 0.5 mL (PF) 12/08/2019    Influenza vaccine, quadrivalent, 3 years and older (FLUZONE), single-dose preservative free, 0.5 mL 01/06/2018    Meningococcal polysaccharide MPSV4 06/30/2013    PNEUMOCOCCAL CONJUGATE 20-VALENT PF 03/21/2021    Tdap (tetanus, diphtheria reduced, acellular pertussis), adsorbed vaccine 06/30/2013, 02/14/2019    Typhoid vaccine 06/30/2013    Yellow Fever vaccine 06/30/2013    Zoster (SHINGRIX ) vaccine, recombinant 02/27/2020, 05/21/2020     Health Maintenance   Topic Date Due    Advance Directive on File  Never done    Colon Cancer Screening Cologuard  12/14/2020    COVID-19  Vaccine (6 - 2024-25 season) 09/27/2022    DEPRESSION SCREENING  03/15/2024    Tetanus Ten-Year  02/13/2029    HEPATITIS C SCREENING  Completed    INFLUENZA VACCINE  Completed    Shingrix  Vaccine 50+  Completed    Pneumonia Vaccine Age 12 Years and Older  Completed    COLONOSCOPY TEN YEARS  Discontinued   :  Current Medications[2]  Allergies[3]  Social History[4]  Family History[5]    Review of Systems  CONST: No drastic weight change, no fevers/chills sweats, fatigue, muscle aches  EYES: No blurry vision, double vision, loss of peripheral vision - had recent eye exam  EARS: No hearing loss, tinnitus, pain or discharge.   NOSE: No congestion, runny nose or bloody nose  MOUTH: No sore throat, oral lesions, difficulty swallowing  NECK: No swollen glands, stiffness or pain  CV: No CP, palpitations, leg swelling, changes in exercise tolerance  RESP: No SOB, cough, wheeze, asthma  GI: No n/v, diarrhea, constipation, abd pain, heartburn, blood in stool  MALE GU: No dysuria, frequency, hematuria, nocturia, testicular changes, erectile dysfunction. Urinary stream is improved on tamsulosin .   MSK:  See HPI  SKIN:   No rashes lumps, sores or concerning moles.   NEURO:  No HA, LH, dizziness, LOC, numbness/tingling, weakness  PSYCH:  No depressed mood, anhedonia, anxiety    Physical Exam:  BP 120/80 (BP Site: Left arm, Patient Position: Sitting, Cuff Size: Medium)   Pulse (!) 49   Temp 97.7 F (36.5 C)   Resp 18   Ht 1.783 m (5' 10.2)   Wt 82.3 kg (181 lb 8 oz)   SpO2 97%   BMI 25.89 kg/m   Wt Readings from Last 3 Encounters:   03/24/23 82.3 kg (181 lb 8 oz)   03/16/23 82.3 kg (181 lb 8 oz)   04/29/22 82.1 kg (181 lb)     GEN: well developed well nourished male in no apparent distress  HENT: NCAT, tympanic membrane normal bilaterally, no nasal congestion, oropharynx normal w no exudate or erythema  EYES: PERRL, EOMI, no pallor or scleral icterus, normal conjunctiva  NECK: supple, no thyromegaly or nodules  appreciated  LYMPH: no cervical or supraclavicular LAD appreciated  CV: RRR, no m/r/g.  No LE edema.  2+ radial pulses present and equal.  RESP: CTAB, normal effort, no rhonchi or rales  GI: soft, nontender/ nondistended, NABS, no rebound or guarding, no evident HSM  GU: NEMG no masses or hernia evident or palpable. Healed scar from prior hernia surgery  SKIN: warm, dry.  no visible rashes. No concerning lesions on face. Fair skin with photo-damage on sun exposed areas.   MSK: Normal bulk and tone, normal strength 5/5 and sensation UE / LE.  NEURO: PERRLA, EOMI, face symmetric, hearing intact to voice, palate raise, shoulder shrug and neck flexion intact, tongue midline. MAE.  PSYCH: normal mood and affect    Audiology test:   normal  Vision test:  not done as he had eye exam recently  InBody: Reviewed results of InBody with patient - notable for BMI 25 Percentage Body Fat 18.9 and Visceral Fat 6    Clinical Support on 03/16/2023   Component Date Value Ref Range Status    Hemoglobin A1C 03/16/2023 5.3  4.6 - 5.6 % Final    Per ADA Guidelines hemoglobin A1c values of 5.7-6.4% indicate an increased risk for developing diabetes mellitus. Hemoglobin A1c values greater than or equal to 6.5% are diagnostic of diabetes mellitus. A triglyceride result greater than 3000 mg/dL can falsely decrease YAJ8r results.Congenital hemoglobinopathy can falsely lower the Hemoglobin A1c values - Boronate-affinity HbA1C is recommended.    Test performed using Abbott Alinity enzymatic method.    Average Estimated Glucose 03/16/2023 105.4  mg/dL Final    TSH 97/81/7974 2.25  0.35 - 4.94 uIU/mL Final    Cholesterol 03/16/2023 196  <=199 mg/dL Final  Triglycerides 03/16/2023 59  34 - 149 mg/dL Final    HDL 97/81/7974 52  >=40 mg/dL Final    An HDL Cholesterol 40mg /dL is low and constitutes a coronary heart disease risk factor, and an HDL Cholesterol >59mg /dL is a negative risk factor for CHD.     Reference: American Heart Association;  Circulation 2004    LDL Calculated 03/16/2023 867 (H)  0 - 99 mg/dL Final    The following National Cholesterol Education Program (NCEP) cutpoints for patient classifications are used for the prevention and management of coronary heart disease.    Reference Range (mg/dL)  < 899           Optimal  100 to 129      Near or above optimal  130 to 159      Borderline High  160 to 189      High  >= 190          Very High    VLDL Calculated 03/16/2023 12  10 - 40 mg/dL Final    Cholesterol / HDL Ratio 03/16/2023 3.8  Index Final    Cholesterol/HDL Ratio:  Classification                   Male     Male  Very Low (1/2 Average Risk)      <3.4     <3.3  Low Risk                          4.0      3.8  Average Risk                      5.0      4.5  Moderate Risk(2x  Average Risk)   9.5      7.0  High Risk (3x Average Risk)      >23.0    >11.0      Bilirubin, Total 03/16/2023 0.8  0.2 - 1.2 mg/dL Final    Bilirubin Direct 03/16/2023 0.3  0.0 - 0.5 mg/dL Final    Bilirubin Indirect 03/16/2023 0.5  0.2 - 1.0 mg/dL Final    AST (SGOT) 97/81/7974 26  <=41 U/L Final    ALT 03/16/2023 26  <=55 U/L Final    Alkaline Phosphatase 03/16/2023 51  37 - 117 U/L Final    Albumin 03/16/2023 4.0  3.5 - 5.0 g/dL Final    Globulin 97/81/7974 2.8  2.0 - 3.6 g/dL Final    Albumin/Globulin Ratio 03/16/2023 1.4  0.9 - 2.2 Final    Protein, Total 03/16/2023 6.8  6.0 - 8.3 g/dL Final    Hemolysis Index 03/16/2023 6  Index Final    Hemolysis Index is an internal quality control value and not a diagnostic indicator.    Glucose 03/16/2023 91  70 - 100 mg/dL Final    ADA Guidelines for diabetes mellitus:  Fasting: Equal to or greater than 126 mg/dL  Random: Equal to or greater than 200 mg/dL    BUN 97/81/7974 14  9 - 28 mg/dL Final    Creatinine 97/81/7974 1.1  0.5 - 1.5 mg/dL Final    Calcium 97/81/7974 9.4  8.5 - 10.5 mg/dL Final    Sodium 97/81/7974 140  135 - 145 mEq/L Final    Potassium 03/16/2023 4.5  3.5 - 5.3 mEq/L Final    Chloride 03/16/2023 109   99 - 111 mEq/L Final  CO2 03/16/2023 24  17 - 29 mEq/L Final    Anion Gap 03/16/2023 7.0  5.0 - 15.0 Final    Calculated Anion Gap = Na - (Cl + CO2)  Interpret with caution; calculated Anion Gap may not reflect patient's true clinical status.     This is a calculated value and platform-dependent. A value >12.0 has been recommended for the management of Hyperglycemic Crises: Diabetic Ketoacidosis and Hyperglycemic Hyperosmolar State.Med Clin [place]. 2017;101(3):587-606.doi,10.1016/j.mcna.2016.12.011    GFR 03/16/2023 >60.0  >=60.0 mL/min/1.73 m2 Final    Reported eGFR is based on the CKD-EPI 2021 equation that does not use a race coefficient. This equation is used for all patients (both Black and non-Black), and old and new GFR estimates may differ by more than 10%, particularly at higher eGFRcr values and at younger ages. For eGFR of 45-59 mL/min/1.73 m2 with uACR <30 mg/g, please check NKF KDOQI and KDIGO guidelines:     https://www.kidney.org/professionals/kdoqi    GFR estimates are unreliable in patient with:     Rapidly changing kidney function or recent dialysis, extreme age, body size or body composition (obesity, severe malnutrition). Abnormal muscle mass (limb amputation, muscle wasting). In these patients, alternative determinations of GFR should be obtained.    Hemolysis Index 03/16/2023 6  Index Final    Hemolysis Index is an internal quality control value and not a diagnostic indicator.    Ventricular Rate 03/16/2023 38  BPM Preliminary    Atrial Rate 03/16/2023 38  BPM Preliminary    P-R Interval 03/16/2023 200  ms Preliminary    QRS Duration 03/16/2023 74  ms Preliminary    Q-T Interval 03/16/2023 484  ms Preliminary    QTC Calculation (Bezet) 03/16/2023 384  ms Preliminary    P Axis 03/16/2023 67  degrees Preliminary    R Axis 03/16/2023 65  degrees Preliminary    T Axis 03/16/2023 75  degrees Preliminary    IHS MUSE NARRATIVE AND IMPRESSION 03/16/2023    Preliminary                     Value:MARKED SINUS BRADYCARDIA  ABNORMAL ECG  WHEN COMPARED WITH ECG OF 23-Apr-2022 10:15,  NO SIGNIFICANT CHANGE WAS FOUND      WBC 03/16/2023 5.55  3.10 - 9.50 x10 3/uL Final    Hemoglobin 03/16/2023 14.2  12.5 - 17.1 g/dL Final    Hematocrit 97/81/7974 40.9  37.6 - 49.6 % Final    Platelet Count 03/16/2023 211  142 - 346 x10 3/uL Final    MPV 03/16/2023 11.3  8.9 - 12.5 fL Final    RBC 03/16/2023 4.61  4.20 - 5.90 x10 6/uL Final    MCV 03/16/2023 88.7  78.0 - 96.0 fL Final    MCH 03/16/2023 30.8  25.1 - 33.5 pg Final    MCHC 03/16/2023 34.7  31.5 - 35.8 g/dL Final    RDW 97/81/7974 13  11 - 15 % Final    nRBC % 03/16/2023 0.0  <=0.0 /100 WBC Final    Absolute nRBC 03/16/2023 0.00  <=0.00 x10 3/uL Final    Preliminary Absolute Neutrophil Co* 03/16/2023 3.21  1.10 - 6.33 x10 3/uL Final    [facility] is a preliminary result.  Final result may be different following review of the peripheral blood smear.     Neutrophils % 03/16/2023 57.8  Not Established % Final    Lymphocytes % 03/16/2023 27.4  Not Established % Final    Monocytes %  03/16/2023 9.2  Not Established % Final    Eosinophils % 03/16/2023 3.8  Not Established % Final    Basophils % 03/16/2023 1.1  Not Established % Final    Immature Granulocytes % 03/16/2023 0.7  Not Established % Final    Absolute Neutrophils 03/16/2023 3.21  1.10 - 6.33 x10 3/uL Final    Absolute Lymphocytes 03/16/2023 1.52  0.42 - 3.22 x10 3/uL Final    Absolute Monocytes 03/16/2023 0.51  0.21 - 0.85 x10 3/uL Final    Absolute Eosinophils 03/16/2023 0.21  0.00 - 0.44 x10 3/uL Final    Absolute Basophils 03/16/2023 0.06  0.00 - 0.08 x10 3/uL Final    Absolute Immature Granulocytes 03/16/2023 0.04  0.00 - 0.07 x10 3/uL Final    Urine Microalbumin 03/16/2023 5.0  0.0 - 30.0 ug/ml Final    Urine Creatinine 03/16/2023 143.0  Not Established mg/dL Final    Urine Microalbumin/Creatinine Ratio 03/16/2023 <5  <30 ug/mg Final    Prostate Specific Antigen, Total 03/16/2023 0.550  0.000 - 4.000 ng/mL  Final    Results obtained for this assay may not be used interchangeably with values obtained with different manufacturer's assay method. This assay is tested using a chemiluminescent microparticle immunoassay (CMIA) on the Abbott Alinity at the [employer].    Vitamin D  25-OH, Total 03/16/2023 52  30 - 100 ng/mL Final    Testing performed using Abbott Alinity chemiluminescent microparticle immunoassay (CMIA) methodology. Method dependent minor differences may exist based on the test platformed used.    Vitamin D  Levels of < 20 ng/mL are considered deficiency.  Vitamin D  levels of 20-30 ng/mL suggest insufficiency.  Optimal Vitamin D  levels are >31 ng/mL.    Uric Acid 03/16/2023 5.6  3.6 - 9.7 mg/dL Final    High Sensitivity C-Reactive Protein 03/16/2023 0.03  0.00 - 1.00 mg/dL Final    Urine Color 97/81/7974 Straw  Colorless, Straw or Yellow Final    Urine Clarity 03/16/2023 Clear  Clear, Hazy Final    Urine Specific Gravity 03/16/2023 1.020  1.001 - 1.035 Final    Urine pH 03/16/2023 5.5  5.0 - 8.0 Final    Urine Leukocyte Esterase 03/16/2023 Negative  Negative Final    Urine Nitrite 03/16/2023 Negative  Negative Final    Urine Protein 03/16/2023 Negative  Negative Final    Urine Glucose 03/16/2023 Negative  Negative Final    Urine Ketones 03/16/2023 Negative  Negative mg/dL Final    Urine Urobilinogen 03/16/2023 Normal  0.2 - 2.0 mg/dL Final    Urine Bilirubin 03/16/2023 Negative  Negative Final    Urine Blood 03/16/2023 Negative  Negative Final    Extra Tube 03/16/2023 Hold for add-ons.   Final    Auto-resulted.    Lipoprotein (a) 03/16/2023 89 (H)  <75 nmol/L Final    Comment: Lp(a) confers increased risk for coronary   disease and aortic stenosis starting at   concentrations of 75 nmol/L and greater.    Lp(a) >=125 nmol/L is considered a risk-enhancing   factor for cardiovascular disease by several   professional societies.  Clinician-patient   discussion of therapeutic strategy is warranted.      -------------------ADDITIONAL INFORMATION-------------------  Please notice that Lp(a) values are reported in   molar units (nmol/L).  These units are recommended   by professional society guidelines and expert   opinion statements.  Measured results and risk   thresholds are higher than those generated using   mass units (mg/dL). Cardiovascular risk increases  starting at 75 nmol/L. Lp(a) >=125 nmol/L is   considered a risk enhancing factor by the American   Heart Association.     This test has been modified from the manufacturer's  instructions. Its performance characteristics were  determined by [facility] in a manner consistent with  CLIA requirements. This                            test has not been cleared or  approved by the U.S. Food and Drug Administration.     Test Performed by:  [facility]  [address] [place], [place], [place] [address]  Lab Director: Dolphus A. Verba Ph.D.; CLIA# [id number]    Apolipoprotein B 03/16/2023 101 (H)  mg/dL Final       -------------------REFERENCE VALUE--------------------------  Desirable: <90  Above Desirable: 90-99   Borderline high: 100-119  High: 120-139  Very high: > or = 140     Test Performed by:  [facility]  [address] [place], [place], [place] [address]  Lab Director: Dolphus A. Verba Ph.D.; CLIA# [id number]    Vitamin B-12 03/16/2023 531  211 - 911 pg/mL Final        Assessment/Plan:    1. Elevated Lp(a) (Primary)  Screen for bicuspid aortic valve - order and instructions provided.   - Echo Adult TTE Complete; Future    Given coronary calcium score, elevated Lp(a), goal LDL is 70 or below. However, his father was thought to have developed inclusion body myositis and dementia - attributed to statin - his wife had shared this with me also. Hence, he is reluctant to go on a statin. He seems to now be more open to considering non-statin options.     2. Primary hypertension  BP is well controlled.    - Echo Adult TTE Complete; Future  - losartan  (COZAAR ) 25 MG tablet; Take 1 tablet (25 mg) by mouth daily  Dispense: 90 tablet; Refill: 3    3. Bradycardia  No concerning clinical symptoms. Likely has a high vagal tone given former runner and lean/regular exercises. No dizziness, no fainting, no palpitations.   - Echo Adult TTE Complete; Future    4. Screening for AAA (abdominal aortic aneurysm)  - US  Abd Aortic Aneurysm Screening; Future    5. Colon cancer screening  Willing to proceed.   - Referral to Gastroenterology ([facility]); Future    6. Lower urinary tract symptoms (LUTS)  Improved with medication. PSA is normal.  - tamsulosin  (FLOMAX ) 0.4 MG Cap; Take 1 capsule (0.4 mg) by mouth Daily after dinner  Dispense: 90 capsule; Refill: 3    7. Annual Exam  Anticipatory guidance as outlined in AVS d/w him in person.    Encouraged him to see dentist at least twice a year - he has good dentition overall but is overdue to see one. He has scheduled one     Encourage him to see a dermatologist at least once a year due to family history of melanoma even without any concerning lesions, an annual total body exam by a dermatologist is recommended.    Immunizations up to date except for COVID booster - wants to defer this and ok at this point.      He is commended for staying in shape and staying active.     Orders Placed This Encounter   Procedures    US  Abd Aortic Aneurysm Screening  Referral to Gastroenterology [facility])    Echo Adult TTE Complete       Hollie Phillips, MD, [facility] [address]  [address], [address]  [place], [place] [address]  P) [phone]  F) [phone]  [email or web address]            [1]   Past Surgical History:  Procedure Laterality Date    HERNIA REPAIR      HERNIORRHAPHY, INGUINAL Left 12/16/2017    Procedure: HERNIORRHAPHY, REPAIR DIRECT INGUINAL WITH MESH;  Surgeon: Dannielle Evalene SAUNDERS, MD;  Location: [facility];  Service: General;  Laterality: Left;    R shoulder surgery Right  04/01/2021    SKIN BIOPSY      chest    TONSILLECTOMY  age 51    TOOTH EXTRACTION Left 03/2018    left upper molar    VASECTOMY  1992    VASECTOMY  1992    VASECTOMY      25 yrs ago    WISDOM TOOTH EXTRACTION  age 67   [2]   Current Outpatient Medications   Medication Sig Dispense Refill    Amino Acids (AMINO ACID PO) Take by mouth Daily      Ascorbic Acid (VITAMIN C) 1000 MG tablet Take 1 tablet (1,000 mg) by mouth daily      aspirin EC 81 MG EC tablet Take 1 tablet (81 mg) by mouth daily      CALCIUM-MAGNESIUM PO Take 1 capsule by mouth daily      Cholecalciferol (VITAMIN D3 PO) Take 5,000 IU by mouth daily         Coenzyme Q10 (CoQ-10) 100 MG Cap Take 1 capsule (100 mg) by mouth daily      Collagen Hydrolysate Powder Use One scoop in morning      COLOSTRUM PO Take 1,000 mg by mouth twice a week         ECHINACEA PO Take 400 mg by mouth as needed      Fiber Powder Take 1 scoop. by mouth daily Prebio Thrive      Fluticasone Propionate (FLONASE NA) by Nasal route as needed      Glucosamine HCl-MSM (GLUCOSAMINE-MSM PO) Take 1 capsule by mouth daily      loratadine (CLARITIN) 10 MG tablet Take 1 tablet (10 mg) by mouth as needed for Allergies      Omega-3 Fatty Acids (OMEGA 3 PO) Take 1,000 mg by mouth daily          Protein Powder Take by mouth daily Plant-based      losartan  (COZAAR ) 25 MG tablet Take 1 tablet (25 mg) by mouth daily 90 tablet 3    tamsulosin  (FLOMAX ) 0.4 MG Cap Take 1 capsule (0.4 mg) by mouth Daily after dinner 90 capsule 3    Taurine 1000 MG Cap Take 1 capsule (1,000 mg) by mouth daily (Patient not taking: Reported on 03/24/2023)      UNABLE TO FIND Take 1 scoop. by mouth daily Med Name: Vital Reds (contains polyphenols 1760mg , Metabolic enhancing blend 365 mg, Digestive support blend 3 billion CFUs   (Patient not taking: Reported on 03/24/2023)      UNABLE TO FIND Take 1 Scoop by mouth daily Med Name: Primal Plants: greens blend 1,000 mg, Metbolic enhancing 415mg , Digestive support 1350 mg,  Probiotic blend 3 billion CFU (Patient not taking: Reported on 03/24/2023)       No current facility-administered medications for this visit.   [3]   Allergies  Allergen Reactions    Dust Mite Mixed Allergen Ext [Mite (D. Farinae)]     Other      Ragweed, seasonal allergies, animal dander   [4]   Social History  Socioeconomic History    Marital status: Married    Number of children: 2   Tobacco Use    Smoking status: Some Days     Types: Cigars    Smokeless tobacco: Never    Tobacco comments:     occasional cigar once or twice a month   Vaping Use    Vaping status: Never Used   Substance and Sexual Activity    Alcohol use: Yes     Alcohol/week: 4.0 standard drinks of alcohol     Types: 4 Drinks containing 0.5 oz of alcohol per week     Comment: 4 glasses/week; wine or liquor    Drug use: Never    Sexual activity: Not Currently     Partners: Female     Birth control/protection: Surgical     Social Drivers of Health     Financial Resource Strain: Low Risk  (03/10/2023)    Overall Financial Resource Strain (CARDIA)     Difficulty of Paying Living Expenses: Not hard at all   Food Insecurity: No Food Insecurity (03/10/2023)    Hunger Vital Sign     Worried About Running Out of Food in the Last Year: Never true     Ran Out of Food in the Last Year: Never true   Transportation Needs: No Transportation Needs (03/10/2023)    PRAPARE - Therapist, Art (Medical): No     Lack of Transportation (Non-Medical): No   Physical Activity: Sufficiently Active (03/10/2023)    Exercise Vital Sign     Days of Exercise per Week: 7 days     Minutes of Exercise per Session: 40 min   Stress: Stress Concern Present (03/10/2023)    [employer] of Occupational Health - Occupational Stress Questionnaire     Feeling of Stress : Very much   Social Connections: Socially Integrated (03/10/2023)    Social Connection and Isolation Panel [NHANES]     Frequency of Communication with Friends and Family: More than three times a  week     Frequency of Social Gatherings with Friends and Family: Twice a week     Attends Religious Services: 1 to 4 times per year     Active Member of [employer] or Organizations: No     Attends Engineer, Structural: 1 to 4 times per year     Marital Status: Married   Catering Manager Violence: Not At Risk (03/10/2023)    Humiliation, Afraid, Rape, and Kick questionnaire     Fear of Current or Ex-Partner: No     Emotionally Abused: No     Physically Abused: No     Sexually Abused: No   Housing Stability: Low Risk  (03/10/2023)    Housing Stability Vital Sign     Unable to Pay for Housing in the Last Year: No     Number of Times Moved in the Last Year: 0     Homeless in the Last Year: No   [5]   Family History  Problem Relation Name Age of Onset    Cancer Mother          Bladder    Stroke Mother      Heart disease Father      Dementia Father  Melanoma Father      Myopathy Father      No known problems Sister Asberry     No known problems Brother Medford     Diabetes type I Daughter Lauren     Thyroid  disease Daughter Tinnie         s/p ablation    No known problems Son Rosalva     Obesity Maternal Grandmother          overweight    No known problems Maternal Grandfather      Alzheimer's disease Paternal Grandmother      Heart disease Paternal Grandfather      Heart attack Paternal Grandfather

## 2023-03-24 NOTE — Patient Instructions (Addendum)
 Dear Mr. Mehra,    It was great to see you. You look well, younger and fit with lean body - keep it up!    Reminders for the next few months to complete    Schedule echo cardiogram of the heart - number on the form.   Schedule a screening ultrasound for abdominal aortic aneurysm - number on the order form.  Wait to hear from GI team about colonoscopy - give them 2-4 weeks. They may call or send a note from My Chart - after that, it still takes some time, so proceed with the motions as requested. If you don't hear anything back, let me know.   See dermatologist for total body skin check and do so annually.  See dentist at least twice a year - you plan to already.   Eye exam was just done and plan to have eyes checked annually.  Please give some thought about either Bemepdoic Acid or PSK-9 inhibitor (injectables) and as we discussed, we should take steps to lower the LDL cholesterol which needs addition of medication in addition to an already healthy lifestyle. Goal LDL cholesterol is 70 or lower in your case. These medications are not statins and will help with long term risk reduction of cardiovascular disease. Dr. Vearl remains available or you can also always get a second opinion either within or outside of [place].   Our staff can also help with scheduling 1 and 2 if you hit any roadblocks.    Best,  Hollie Phillips, MD

## 2023-05-05 ENCOUNTER — Encounter: Payer: No Typology Code available for payment source | Admitting: Hospitalist

## 2023-05-25 ENCOUNTER — Encounter (INDEPENDENT_AMBULATORY_CARE_PROVIDER_SITE_OTHER): Payer: Self-pay

## 2023-05-25 ENCOUNTER — Telehealth (INDEPENDENT_AMBULATORY_CARE_PROVIDER_SITE_OTHER): Payer: Self-pay

## 2023-05-25 NOTE — Telephone Encounter (Signed)
 LVM to call back and schedule CLN, mychart message send too.

## 2023-11-22 ENCOUNTER — Other Ambulatory Visit: Payer: Self-pay | Admitting: Hospitalist

## 2024-03-28 ENCOUNTER — Encounter: Payer: No Typology Code available for payment source | Admitting: Hospitalist
